# Patient Record
Sex: Female | Born: 1980 | Race: Black or African American | Hispanic: No | Marital: Married | Smoking: Never smoker
Health system: Southern US, Community
[De-identification: ages and names within clinical notes are randomized; demographics above are authoritative.]

## PROBLEM LIST (undated history)

## (undated) DIAGNOSIS — R519 Headache, unspecified: Secondary | ICD-10-CM

## (undated) DIAGNOSIS — O24419 Gestational diabetes mellitus in pregnancy, unspecified control: Secondary | ICD-10-CM

## (undated) DIAGNOSIS — I10 Essential (primary) hypertension: Secondary | ICD-10-CM

## (undated) DIAGNOSIS — R87629 Unspecified abnormal cytological findings in specimens from vagina: Secondary | ICD-10-CM

## (undated) DIAGNOSIS — R51 Headache: Secondary | ICD-10-CM

## (undated) DIAGNOSIS — R000 Tachycardia, unspecified: Secondary | ICD-10-CM

## (undated) DIAGNOSIS — Z9889 Other specified postprocedural states: Secondary | ICD-10-CM

## (undated) DIAGNOSIS — O09299 Supervision of pregnancy with other poor reproductive or obstetric history, unspecified trimester: Secondary | ICD-10-CM

## (undated) DIAGNOSIS — D649 Anemia, unspecified: Secondary | ICD-10-CM

## (undated) DIAGNOSIS — K219 Gastro-esophageal reflux disease without esophagitis: Secondary | ICD-10-CM

## (undated) HISTORY — PX: LEEP: SHX91

## (undated) HISTORY — PX: CERVICAL CERCLAGE: SHX1329

## (undated) HISTORY — DX: Gestational diabetes mellitus in pregnancy, unspecified control: O24.419

## (undated) HISTORY — PX: FOOT SURGERY: SHX648

## (undated) HISTORY — PX: ABDOMINAL CERCLAGE: SHX5384

## (undated) HISTORY — DX: Other specified postprocedural states: Z98.890

## (undated) HISTORY — PX: ANKLE SURGERY: SHX546

## (undated) HISTORY — DX: Supervision of pregnancy with other poor reproductive or obstetric history, unspecified trimester: O09.299

---

## 2008-09-24 ENCOUNTER — Emergency Department: Payer: Self-pay | Admitting: Emergency Medicine

## 2008-09-27 ENCOUNTER — Ambulatory Visit: Payer: Self-pay | Admitting: Orthopedic Surgery

## 2008-10-02 ENCOUNTER — Ambulatory Visit: Payer: Self-pay | Admitting: Orthopedic Surgery

## 2009-11-12 IMAGING — CR RIGHT ANKLE - COMPLETE 3+ VIEW
1 series · 5 of 5 positions shown · non-contrast
Comparison: none

REASON FOR EXAM: ankle pain and deformity s/p fall
COMMENTS:   LMP: One week ago

[Series 1: view not recorded · 0.17mm/px · 5 of 5 slices shown]
[im 1/5]
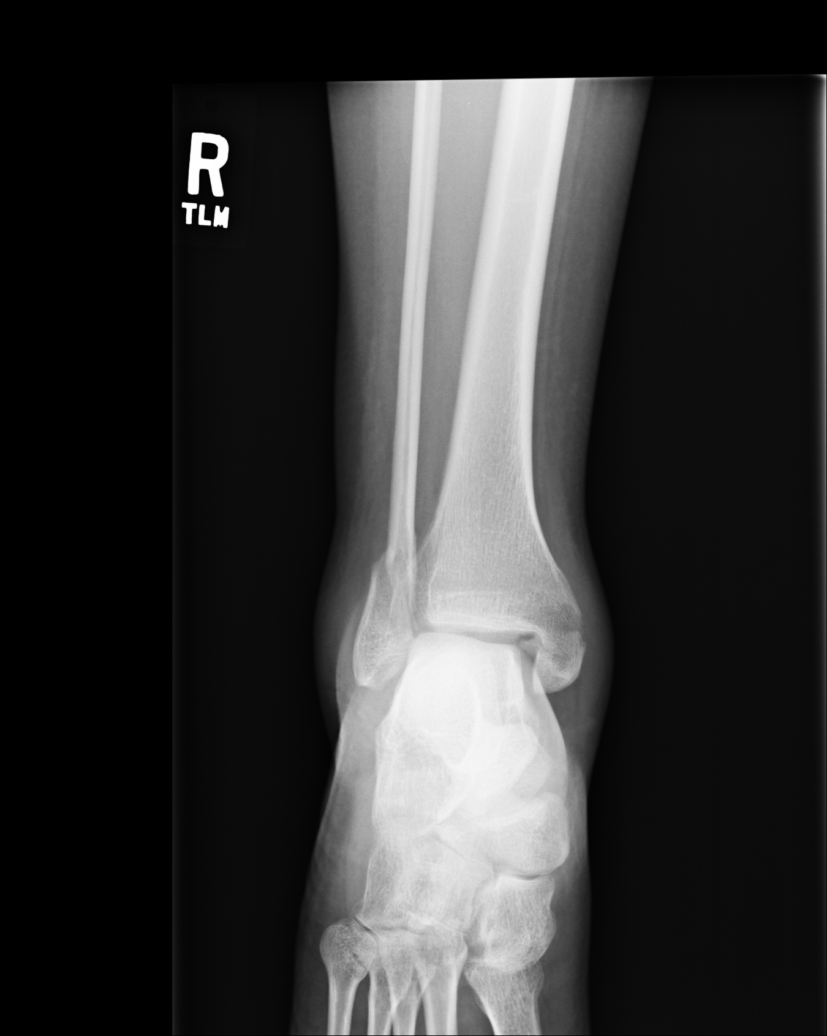
[im 2/5]
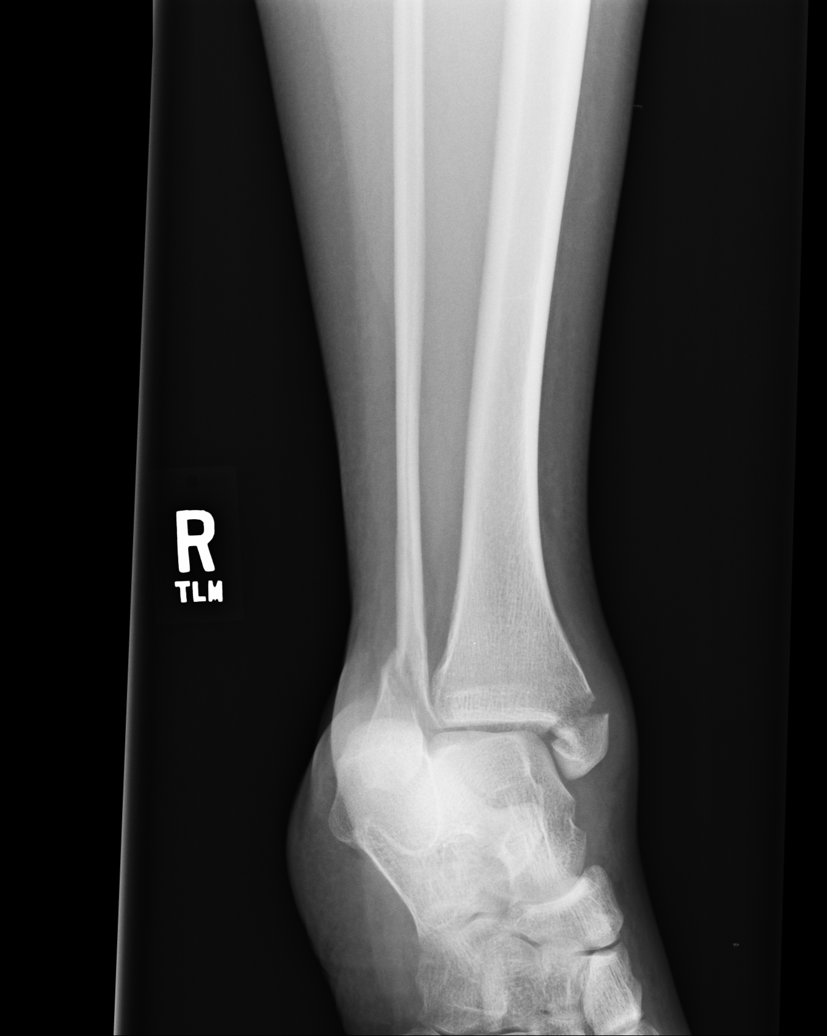
[im 3/5]
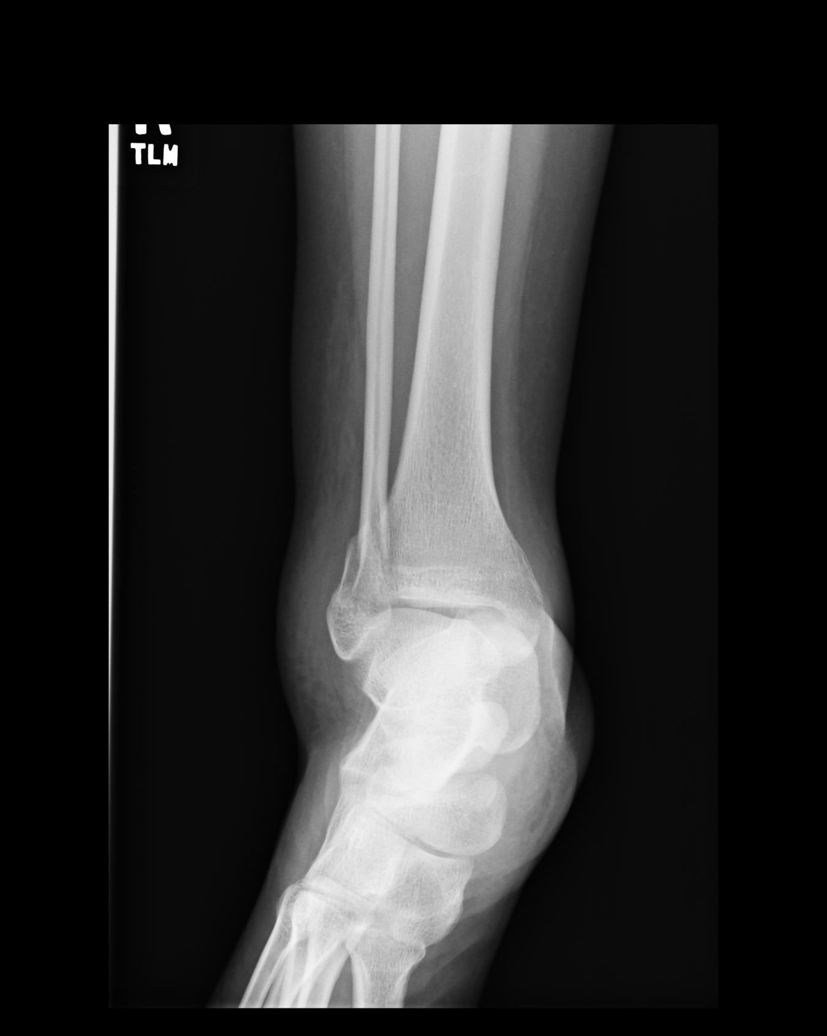
[im 4/5]
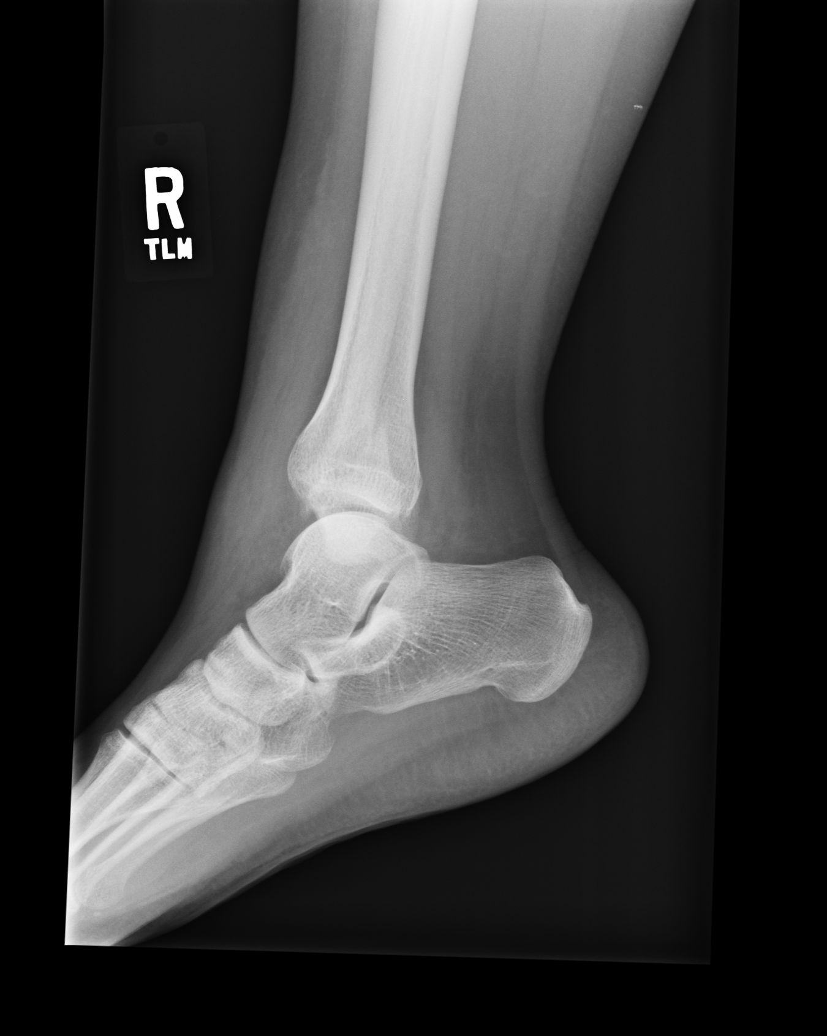
[im 5/5]
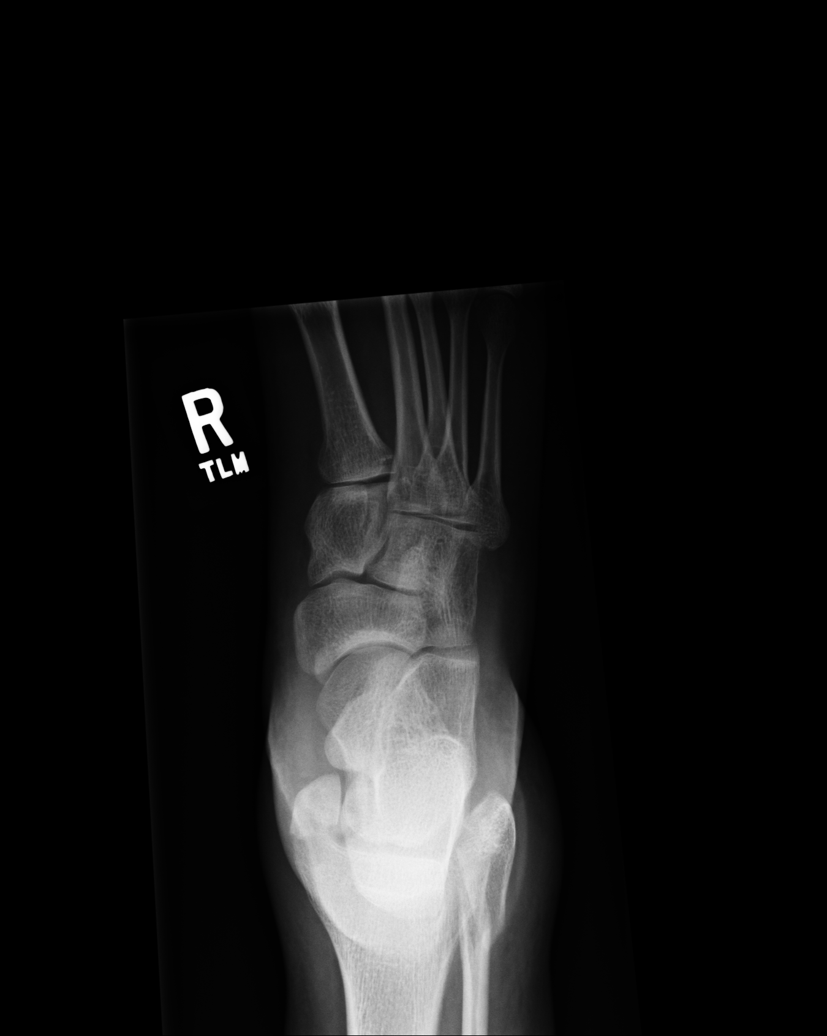

[5 of 5 positions shown; findings below may reference images not displayed]

PROCEDURE:     DXR - DXR ANKLE RIGHT COMPLETE  - September 25, 2008 [DATE]

RESULT:     There is a mildly comminuted fracture of the medial malleolus.
The fracture is open medially by approximately 6 to 7 mm. The ankle mortise
is slightly asymmetrical. An oblique fracture of the fibula is noted with
the distal component approximately 6 mm lateral in position to the proximal
component. No fracture of the posterior tibia is identified on the current
exam.
IMPRESSION: 1.     Bimalleolar fracture with slight asymmetry of the ankle mortise noted.

## 2014-09-16 DIAGNOSIS — O3430 Maternal care for cervical incompetence, unspecified trimester: Secondary | ICD-10-CM | POA: Insufficient documentation

## 2014-12-11 DIAGNOSIS — D252 Subserosal leiomyoma of uterus: Secondary | ICD-10-CM | POA: Insufficient documentation

## 2014-12-24 DIAGNOSIS — O99011 Anemia complicating pregnancy, first trimester: Secondary | ICD-10-CM | POA: Insufficient documentation

## 2015-05-21 ENCOUNTER — Encounter: Payer: Federal, State, Local not specified - PPO | Attending: Obstetrics

## 2015-05-21 ENCOUNTER — Ambulatory Visit: Payer: Self-pay

## 2015-05-21 VITALS — Ht 67.5 in | Wt 247.7 lb

## 2015-05-21 DIAGNOSIS — O24419 Gestational diabetes mellitus in pregnancy, unspecified control: Secondary | ICD-10-CM | POA: Diagnosis present

## 2015-05-21 DIAGNOSIS — Z713 Dietary counseling and surveillance: Secondary | ICD-10-CM | POA: Insufficient documentation

## 2015-05-27 NOTE — Progress Notes (Signed)
  Patient was seen on 05/21/15 for Gestational Diabetes self-management . The following learning objectives were met by the patient :   States the definition of Gestational Diabetes  States why dietary management is important in controlling blood glucose  Describes the effects of carbohydrates on blood glucose levels  Demonstrates ability to create a balanced meal plan  Demonstrates carbohydrate counting   States when to check blood glucose levels  Demonstrates proper blood glucose monitoring techniques  States the effect of stress and exercise on blood glucose levels  States the importance of limiting caffeine and abstaining from alcohol and smoking  Plan:  Aim for 2 Carb Choices per meal (30 grams) +/- 1 either way for breakfast Aim for 3 Carb Choices per meal (45 grams) +/- 1 either way from lunch and dinner Aim for 1-2 Carbs per snack Begin reading food labels for Total Carbohydrate and sugar grams of foods Consider  increasing your activity level by walking daily as tolerated Begin checking BG before breakfast and 1-2 hours after first bit of breakfast, lunch and dinner after  as directed by MD  Take medication  as directed by MD  Blood glucose monitor given: One Touch Verio Flex Lot # [id number] Exp: 05/2016  Patient instructed to monitor glucose levels: FBS: 60 - <90 1 hour: <140 2 hour: <120  Patient received the following handouts:  Nutrition Diabetes and Pregnancy  Carbohydrate Counting List  Meal Planning worksheet  Patient will be seen for follow-up as needed.    

## 2015-06-06 ENCOUNTER — Other Ambulatory Visit: Payer: Self-pay | Admitting: Obstetrics

## 2015-06-29 ENCOUNTER — Encounter (HOSPITAL_COMMUNITY): Payer: Self-pay

## 2015-06-29 ENCOUNTER — Inpatient Hospital Stay (HOSPITAL_COMMUNITY): Payer: Federal, State, Local not specified - PPO | Admitting: Anesthesiology

## 2015-06-29 ENCOUNTER — Inpatient Hospital Stay (HOSPITAL_COMMUNITY): Payer: Federal, State, Local not specified - PPO

## 2015-06-29 ENCOUNTER — Encounter (HOSPITAL_COMMUNITY)
Admission: EM | Disposition: A | Discharge status: Home / Self Care | Payer: Self-pay | Source: Ambulatory Visit | Attending: Obstetrics & Gynecology

## 2015-06-29 ENCOUNTER — Inpatient Hospital Stay (HOSPITAL_COMMUNITY)
Admission: EM | Admit: 2015-06-29 | Discharge: 2015-07-03 | DRG: 765 | Disposition: A | Discharge status: Home / Self Care | Payer: Federal, State, Local not specified - PPO | Source: Ambulatory Visit | Attending: Obstetrics & Gynecology | Admitting: Obstetrics & Gynecology

## 2015-06-29 DIAGNOSIS — O3433 Maternal care for cervical incompetence, third trimester: Secondary | ICD-10-CM | POA: Diagnosis present

## 2015-06-29 DIAGNOSIS — O9902 Anemia complicating childbirth: Secondary | ICD-10-CM | POA: Diagnosis present

## 2015-06-29 DIAGNOSIS — O9963 Diseases of the digestive system complicating the puerperium: Secondary | ICD-10-CM | POA: Diagnosis not present

## 2015-06-29 DIAGNOSIS — O4693 Antepartum hemorrhage, unspecified, third trimester: Secondary | ICD-10-CM | POA: Diagnosis present

## 2015-06-29 DIAGNOSIS — Z9114 Patient's other noncompliance with medication regimen: Secondary | ICD-10-CM

## 2015-06-29 DIAGNOSIS — O4103X0 Oligohydramnios, third trimester, not applicable or unspecified: Secondary | ICD-10-CM | POA: Diagnosis present

## 2015-06-29 DIAGNOSIS — O99214 Obesity complicating childbirth: Secondary | ICD-10-CM | POA: Diagnosis present

## 2015-06-29 DIAGNOSIS — O321XX0 Maternal care for breech presentation, not applicable or unspecified: Secondary | ICD-10-CM | POA: Diagnosis present

## 2015-06-29 DIAGNOSIS — Z3483 Encounter for supervision of other normal pregnancy, third trimester: Secondary | ICD-10-CM | POA: Diagnosis present

## 2015-06-29 DIAGNOSIS — Z3A35 35 weeks gestation of pregnancy: Secondary | ICD-10-CM | POA: Diagnosis not present

## 2015-06-29 DIAGNOSIS — K5900 Constipation, unspecified: Secondary | ICD-10-CM | POA: Diagnosis not present

## 2015-06-29 DIAGNOSIS — D62 Acute posthemorrhagic anemia: Secondary | ICD-10-CM | POA: Diagnosis present

## 2015-06-29 DIAGNOSIS — O1414 Severe pre-eclampsia complicating childbirth: Principal | ICD-10-CM | POA: Diagnosis present

## 2015-06-29 DIAGNOSIS — O24420 Gestational diabetes mellitus in childbirth, diet controlled: Secondary | ICD-10-CM | POA: Diagnosis present

## 2015-06-29 DIAGNOSIS — E669 Obesity, unspecified: Secondary | ICD-10-CM | POA: Diagnosis present

## 2015-06-29 DIAGNOSIS — Z6839 Body mass index (BMI) 39.0-39.9, adult: Secondary | ICD-10-CM

## 2015-06-29 HISTORY — DX: Anemia, unspecified: D64.9

## 2015-06-29 HISTORY — DX: Headache: R51

## 2015-06-29 HISTORY — DX: Essential (primary) hypertension: I10

## 2015-06-29 HISTORY — DX: Gestational diabetes mellitus in pregnancy, unspecified control: O24.419

## 2015-06-29 HISTORY — DX: Headache, unspecified: R51.9

## 2015-06-29 HISTORY — DX: Unspecified abnormal cytological findings in specimens from vagina: R87.629

## 2015-06-29 LAB — COMPREHENSIVE METABOLIC PANEL
ALT: 15 U/L (ref 14–54)
AST: 21 U/L (ref 15–41)
Albumin: 3 g/dL — ABNORMAL LOW (ref 3.5–5.0)
Alkaline Phosphatase: 72 U/L (ref 38–126)
Anion gap: 10 (ref 5–15)
BUN: 10 mg/dL (ref 6–20)
CO2: 22 mmol/L (ref 22–32)
Calcium: 8.8 mg/dL — ABNORMAL LOW (ref 8.9–10.3)
Chloride: 106 mmol/L (ref 101–111)
Creatinine, Ser: 0.6 mg/dL (ref 0.44–1.00)
GFR calc Af Amer: >60 mL/min (ref >60)
GFR calc non Af Amer: >60 mL/min (ref >60)
Glucose, Bld: 113 mg/dL — ABNORMAL HIGH (ref 65–99)
Potassium: 3.6 mmol/L (ref 3.5–5.1)
Sodium: 138 mmol/L (ref 135–145)
Total Bilirubin: 0.5 mg/dL (ref 0.3–1.2)
Total Protein: 6.9 g/dL (ref 6.5–8.1)

## 2015-06-29 LAB — URINE MICROSCOPIC-ADD ON: RBC / HPF: NONE SEEN RBC/hpf (ref 0–5)

## 2015-06-29 LAB — PROTEIN / CREATININE RATIO, URINE
Creatinine, Urine: 173 mg/dL
Protein Creatinine Ratio: 0.35 mg/mg{Cre} — ABNORMAL HIGH (ref 0.00–0.15)
Total Protein, Urine: 60 mg/dL

## 2015-06-29 LAB — URINALYSIS, ROUTINE W REFLEX MICROSCOPIC
Bilirubin Urine: NEGATIVE
Glucose, UA: NEGATIVE mg/dL
Hgb urine dipstick: NEGATIVE
KETONES UR: 15 mg/dL — AB
NITRITE: NEGATIVE
PROTEIN: 30 mg/dL — AB
Specific Gravity, Urine: 1.02 (ref 1.005–1.030)
pH: 5.5 (ref 5.0–8.0)

## 2015-06-29 LAB — CBC
HCT: 32.7 % — ABNORMAL LOW (ref 36.0–46.0)
Hemoglobin: 10.6 g/dL — ABNORMAL LOW (ref 12.0–15.0)
MCH: 28.2 pg (ref 26.0–34.0)
MCHC: 32.4 g/dL (ref 30.0–36.0)
MCV: 87 fL (ref 78.0–100.0)
Platelets: 251 10*3/uL (ref 150–400)
RBC: 3.76 MIL/uL — ABNORMAL LOW (ref 3.87–5.11)
RDW: 16.5 % — ABNORMAL HIGH (ref 11.5–15.5)
WBC: 6.7 10*3/uL (ref 4.0–10.5)

## 2015-06-29 LAB — TYPE AND SCREEN
ABO/RH(D): A POS
Antibody Screen: NEGATIVE

## 2015-06-29 LAB — PROTIME-INR
INR: 1.03 (ref 0.00–1.49)
Prothrombin Time: 13.7 seconds (ref 11.6–15.2)

## 2015-06-29 LAB — APTT: aPTT: 28 seconds (ref 24–37)

## 2015-06-29 LAB — SAMPLE TO BLOOD BANK

## 2015-06-29 LAB — URIC ACID: Uric Acid, Serum: 5.1 mg/dL (ref 2.3–6.6)

## 2015-06-29 LAB — FIBRINOGEN: Fibrinogen: 544 mg/dL — ABNORMAL HIGH (ref 204–475)

## 2015-06-29 LAB — ABO/RH: ABO/RH(D): A POS

## 2015-06-29 LAB — SAVE SMEAR

## 2015-06-29 SURGERY — Surgical Case
Anesthesia: Spinal

## 2015-06-29 MED ORDER — FENTANYL CITRATE (PF) 100 MCG/2ML IJ SOLN
INTRAMUSCULAR | Status: DC | PRN
  Administered 2015-06-29: 10 ug via EPIDURAL

## 2015-06-29 MED ORDER — ONDANSETRON HCL 4 MG/2ML IJ SOLN
INTRAMUSCULAR | Status: DC | PRN
  Administered 2015-06-29: 4 mg via INTRAVENOUS

## 2015-06-29 MED ORDER — LABETALOL HCL 5 MG/ML IV SOLN
INTRAVENOUS | Status: AC
  Filled 2015-06-29: qty 4

## 2015-06-29 MED ORDER — DIBUCAINE 1 % RE OINT: 1.0000 "application " | TOPICAL_OINTMENT | RECTAL | Status: DC | PRN

## 2015-06-29 MED ORDER — LABETALOL HCL 5 MG/ML IV SOLN
20.0000 mg | Freq: Once | INTRAVENOUS | Status: AC
  Administered 2015-06-29: 20 mg via INTRAVENOUS

## 2015-06-29 MED ORDER — FENTANYL CITRATE (PF) 100 MCG/2ML IJ SOLN
INTRAMUSCULAR | Status: AC
  Filled 2015-06-29: qty 2

## 2015-06-29 MED ORDER — MENTHOL 3 MG MT LOZG
1.0000 | LOZENGE | OROMUCOSAL | Status: DC | PRN
  Filled 2015-06-29: qty 9

## 2015-06-29 MED ORDER — OXYTOCIN 10 UNIT/ML IJ SOLN
INTRAMUSCULAR | Status: AC
  Filled 2015-06-29: qty 4

## 2015-06-29 MED ORDER — KETOROLAC TROMETHAMINE 30 MG/ML IJ SOLN
30.0000 mg | Freq: Four times a day (QID) | INTRAMUSCULAR | Status: AC | PRN
  Filled 2015-06-29: qty 1

## 2015-06-29 MED ORDER — NALOXONE HCL 2 MG/2ML IJ SOSY
1.0000 ug/kg/h | PREFILLED_SYRINGE | INTRAVENOUS | Status: DC | PRN
  Filled 2015-06-29: qty 2

## 2015-06-29 MED ORDER — NALBUPHINE HCL 10 MG/ML IJ SOLN: 5.0000 mg | Freq: Once | INTRAMUSCULAR | Status: DC | PRN

## 2015-06-29 MED ORDER — IBUPROFEN 600 MG PO TABS
600.0000 mg | ORAL_TABLET | Freq: Four times a day (QID) | ORAL | Status: DC
  Administered 2015-06-29 – 2015-07-02 (×10): 600 mg via ORAL
  Filled 2015-06-29 (×11): qty 1

## 2015-06-29 MED ORDER — DIPHENHYDRAMINE HCL 25 MG PO CAPS
25.0000 mg | ORAL_CAPSULE | Freq: Four times a day (QID) | ORAL | Status: DC | PRN
  Administered 2015-06-30: 25 mg via ORAL
  Filled 2015-06-29 (×2): qty 1

## 2015-06-29 MED ORDER — DEXTROSE 5 % IV SOLN
2.0000 g | INTRAVENOUS | Status: AC
  Administered 2015-06-29: 2 g via INTRAVENOUS
  Filled 2015-06-29: qty 2

## 2015-06-29 MED ORDER — ZOLPIDEM TARTRATE 5 MG PO TABS: 5.0000 mg | ORAL_TABLET | Freq: Every evening | ORAL | Status: DC | PRN

## 2015-06-29 MED ORDER — ACETAMINOPHEN 325 MG PO TABS
650.0000 mg | ORAL_TABLET | ORAL | Status: DC | PRN
  Administered 2015-06-30 – 2015-07-02 (×2): 650 mg via ORAL
  Filled 2015-06-29 (×2): qty 2

## 2015-06-29 MED ORDER — LABETALOL HCL 5 MG/ML IV SOLN
20.0000 mg | Freq: Once | INTRAVENOUS | Status: AC
  Administered 2015-06-29: 20 mg via INTRAVENOUS
  Filled 2015-06-29: qty 4

## 2015-06-29 MED ORDER — ACETAMINOPHEN 325 MG PO TABS: 650.0000 mg | ORAL_TABLET | ORAL | Status: DC | PRN

## 2015-06-29 MED ORDER — LACTATED RINGERS IV SOLN
INTRAVENOUS | Status: DC | PRN
  Administered 2015-06-29 (×2): via INTRAVENOUS

## 2015-06-29 MED ORDER — SODIUM CHLORIDE 0.9 % IJ SOLN: 3.0000 mL | INTRAMUSCULAR | Status: DC | PRN

## 2015-06-29 MED ORDER — NALOXONE HCL 0.4 MG/ML IJ SOLN: 0.4000 mg | INTRAMUSCULAR | Status: DC | PRN

## 2015-06-29 MED ORDER — FENTANYL CITRATE (PF) 100 MCG/2ML IJ SOLN
25.0000 ug | INTRAMUSCULAR | Status: DC | PRN
Start: 2015-06-29 — End: 2015-06-29

## 2015-06-29 MED ORDER — LACTATED RINGERS IV SOLN
INTRAVENOUS | Status: DC
  Administered 2015-06-29: 22:00:00 via INTRAVENOUS

## 2015-06-29 MED ORDER — PHENYLEPHRINE 8 MG IN D5W 100 ML (0.08MG/ML) PREMIX OPTIME
INJECTION | INTRAVENOUS | Status: DC | PRN
  Administered 2015-06-29: 50 ug/min via INTRAVENOUS

## 2015-06-29 MED ORDER — NALBUPHINE HCL 10 MG/ML IJ SOLN: 5.0000 mg | INTRAMUSCULAR | Status: DC | PRN

## 2015-06-29 MED ORDER — WITCH HAZEL-GLYCERIN EX PADS: 1.0000 "application " | MEDICATED_PAD | CUTANEOUS | Status: DC | PRN

## 2015-06-29 MED ORDER — CITRIC ACID-SODIUM CITRATE 334-500 MG/5ML PO SOLN
ORAL | Status: AC
  Administered 2015-06-29: 30 mL
  Filled 2015-06-29: qty 15

## 2015-06-29 MED ORDER — LANOLIN HYDROUS EX OINT: 1.0000 "application " | TOPICAL_OINTMENT | CUTANEOUS | Status: DC | PRN

## 2015-06-29 MED ORDER — BUPIVACAINE LIPOSOME 1.3 % IJ SUSP
20.0000 mL | Freq: Once | INTRAMUSCULAR | Status: DC
  Filled 2015-06-29: qty 20

## 2015-06-29 MED ORDER — ACETAMINOPHEN 500 MG PO TABS
1000.0000 mg | ORAL_TABLET | Freq: Four times a day (QID) | ORAL | Status: AC
  Administered 2015-06-29 – 2015-06-30 (×3): 1000 mg via ORAL
  Filled 2015-06-29 (×4): qty 2

## 2015-06-29 MED ORDER — OXYCODONE-ACETAMINOPHEN 5-325 MG PO TABS
2.0000 | ORAL_TABLET | ORAL | Status: DC | PRN
  Administered 2015-07-01: 2 via ORAL
  Filled 2015-06-29: qty 2

## 2015-06-29 MED ORDER — DIPHENHYDRAMINE HCL 50 MG/ML IJ SOLN
12.5000 mg | INTRAMUSCULAR | Status: DC | PRN
  Administered 2015-06-30: 12.5 mg via INTRAVENOUS
  Filled 2015-06-29: qty 1

## 2015-06-29 MED ORDER — KETOROLAC TROMETHAMINE 30 MG/ML IJ SOLN
30.0000 mg | Freq: Four times a day (QID) | INTRAMUSCULAR | Status: AC | PRN
Start: 2015-06-29 — End: 2015-06-30
  Filled 2015-06-29: qty 1

## 2015-06-29 MED ORDER — SIMETHICONE 80 MG PO CHEW
80.0000 mg | CHEWABLE_TABLET | Freq: Three times a day (TID) | ORAL | Status: DC
  Administered 2015-06-30 – 2015-07-03 (×11): 80 mg via ORAL
  Filled 2015-06-29 (×10): qty 1

## 2015-06-29 MED ORDER — LACTATED RINGERS IV SOLN: 2.5000 [IU]/h | INTRAVENOUS | Status: AC

## 2015-06-29 MED ORDER — LABETALOL HCL 100 MG PO TABS
100.0000 mg | ORAL_TABLET | Freq: Three times a day (TID) | ORAL | Status: DC
  Administered 2015-06-29: 100 mg via ORAL
  Filled 2015-06-29: qty 1

## 2015-06-29 MED ORDER — MORPHINE SULFATE (PF) 0.5 MG/ML IJ SOLN
INTRAMUSCULAR | Status: DC | PRN
  Administered 2015-06-29: .2 mg via EPIDURAL

## 2015-06-29 MED ORDER — SCOPOLAMINE 1 MG/3DAYS TD PT72: 1.0000 | MEDICATED_PATCH | Freq: Once | TRANSDERMAL | Status: DC

## 2015-06-29 MED ORDER — DIPHENHYDRAMINE HCL 25 MG PO CAPS
25.0000 mg | ORAL_CAPSULE | ORAL | Status: DC | PRN
  Administered 2015-06-29: 25 mg via ORAL
  Filled 2015-06-29: qty 1

## 2015-06-29 MED ORDER — MAGNESIUM SULFATE BOLUS VIA INFUSION
4.0000 g | Freq: Once | INTRAVENOUS | Status: DC
  Filled 2015-06-29: qty 500

## 2015-06-29 MED ORDER — TETANUS-DIPHTH-ACELL PERTUSSIS 5-2.5-18.5 LF-MCG/0.5 IM SUSP
0.5000 mL | Freq: Once | INTRAMUSCULAR | Status: DC
  Filled 2015-06-29: qty 0.5

## 2015-06-29 MED ORDER — PROMETHAZINE HCL 25 MG/ML IJ SOLN: 6.2500 mg | INTRAMUSCULAR | Status: DC | PRN

## 2015-06-29 MED ORDER — BUPIVACAINE IN DEXTROSE 0.75-8.25 % IT SOLN
INTRATHECAL | Status: DC | PRN
  Administered 2015-06-29: 12.5 mg via INTRATHECAL

## 2015-06-29 MED ORDER — BETAMETHASONE SOD PHOS & ACET 6 (3-3) MG/ML IJ SUSP
12.0000 mg | INTRAMUSCULAR | Status: DC
  Administered 2015-06-29: 12 mg via INTRAMUSCULAR
  Filled 2015-06-29: qty 2

## 2015-06-29 MED ORDER — SODIUM CHLORIDE 0.9 % IJ SOLN
INTRAMUSCULAR | Status: AC
  Filled 2015-06-29: qty 30

## 2015-06-29 MED ORDER — BUPIVACAINE HCL 0.25 % IJ SOLN
INTRAMUSCULAR | Status: DC | PRN
  Administered 2015-06-29: 20 mL

## 2015-06-29 MED ORDER — MEPERIDINE HCL 25 MG/ML IJ SOLN: 6.2500 mg | INTRAMUSCULAR | Status: DC | PRN

## 2015-06-29 MED ORDER — LACTATED RINGERS IV SOLN
INTRAVENOUS | Status: DC
  Administered 2015-06-29: 15:00:00 via INTRAVENOUS

## 2015-06-29 MED ORDER — MAGNESIUM SULFATE 50 % IJ SOLN
2.0000 g/h | INTRAVENOUS | Status: AC
  Administered 2015-06-29: 4 g/h via INTRAVENOUS
  Administered 2015-06-30: 2 g/h via INTRAVENOUS
  Filled 2015-06-29 (×2): qty 80

## 2015-06-29 MED ORDER — OXYTOCIN 10 UNIT/ML IJ SOLN
40.0000 [IU] | INTRAVENOUS | Status: DC | PRN
  Administered 2015-06-29: 40 [IU] via INTRAVENOUS

## 2015-06-29 MED ORDER — ONDANSETRON HCL 4 MG/2ML IJ SOLN
INTRAMUSCULAR | Status: AC
  Filled 2015-06-29: qty 2

## 2015-06-29 MED ORDER — LABETALOL HCL 200 MG PO TABS
200.0000 mg | ORAL_TABLET | Freq: Three times a day (TID) | ORAL | Status: DC
  Administered 2015-06-30 – 2015-07-02 (×7): 200 mg via ORAL
  Filled 2015-06-29 (×2): qty 1
  Filled 2015-06-29 (×7): qty 2

## 2015-06-29 MED ORDER — PRENATAL MULTIVITAMIN CH
1.0000 | ORAL_TABLET | Freq: Every day | ORAL | Status: DC
  Administered 2015-06-30 – 2015-07-03 (×4): 1 via ORAL
  Filled 2015-06-29 (×4): qty 1

## 2015-06-29 MED ORDER — HYDRALAZINE HCL 20 MG/ML IJ SOLN
5.0000 mg | INTRAMUSCULAR | Status: DC | PRN
  Administered 2015-06-29 (×2): 5 mg via INTRAVENOUS
  Filled 2015-06-29 (×2): qty 1

## 2015-06-29 MED ORDER — BUPIVACAINE HCL (PF) 0.25 % IJ SOLN
INTRAMUSCULAR | Status: AC
  Filled 2015-06-29: qty 30

## 2015-06-29 MED ORDER — OXYCODONE-ACETAMINOPHEN 5-325 MG PO TABS
1.0000 | ORAL_TABLET | ORAL | Status: DC | PRN
  Administered 2015-06-30 – 2015-07-01 (×4): 1 via ORAL
  Filled 2015-06-29 (×4): qty 1

## 2015-06-29 MED ORDER — PHENYLEPHRINE 8 MG IN D5W 100 ML (0.08MG/ML) PREMIX OPTIME
INJECTION | INTRAVENOUS | Status: AC
  Filled 2015-06-29: qty 100

## 2015-06-29 MED ORDER — ONDANSETRON HCL 4 MG/2ML IJ SOLN: 4.0000 mg | Freq: Three times a day (TID) | INTRAMUSCULAR | Status: DC | PRN

## 2015-06-29 MED ORDER — SCOPOLAMINE 1 MG/3DAYS TD PT72
MEDICATED_PATCH | TRANSDERMAL | Status: AC
  Filled 2015-06-29: qty 1

## 2015-06-29 MED ORDER — SIMETHICONE 80 MG PO CHEW
80.0000 mg | CHEWABLE_TABLET | ORAL | Status: DC
  Administered 2015-06-30 – 2015-07-02 (×4): 80 mg via ORAL
  Filled 2015-06-29 (×4): qty 1

## 2015-06-29 MED ORDER — BUPIVACAINE LIPOSOME 1.3 % IJ SUSP
INTRAMUSCULAR | Status: DC | PRN
  Administered 2015-06-29: 20 mL

## 2015-06-29 MED ORDER — SENNOSIDES-DOCUSATE SODIUM 8.6-50 MG PO TABS
2.0000 | ORAL_TABLET | ORAL | Status: DC
  Administered 2015-06-30 – 2015-07-02 (×3): 2 via ORAL
  Filled 2015-06-29 (×6): qty 2

## 2015-06-29 MED ORDER — MORPHINE SULFATE (PF) 0.5 MG/ML IJ SOLN
INTRAMUSCULAR | Status: AC
  Filled 2015-06-29: qty 10

## 2015-06-29 MED ORDER — SIMETHICONE 80 MG PO CHEW
80.0000 mg | CHEWABLE_TABLET | ORAL | Status: DC | PRN
  Administered 2015-06-30: 80 mg via ORAL

## 2015-06-29 MED ORDER — SCOPOLAMINE 1 MG/3DAYS TD PT72
MEDICATED_PATCH | TRANSDERMAL | Status: DC | PRN
  Administered 2015-06-29: 1 via TRANSDERMAL

## 2015-06-29 SURGICAL SUPPLY — 42 items
BENZOIN TINCTURE PRP APPL 2/3 (GAUZE/BANDAGES/DRESSINGS) ×3 IMPLANT
CLAMP CORD UMBIL (MISCELLANEOUS) IMPLANT
CLOSURE WOUND 1/2 X4 (GAUZE/BANDAGES/DRESSINGS) ×1
CLOTH BEACON ORANGE TIMEOUT ST (SAFETY) ×3 IMPLANT
CONTAINER PREFILL 10% NBF 15ML (MISCELLANEOUS) IMPLANT
DRAPE SHEET LG 3/4 BI-LAMINATE (DRAPES) IMPLANT
DRSG OPSITE POSTOP 4X10 (GAUZE/BANDAGES/DRESSINGS) ×3 IMPLANT
DURAPREP 26ML APPLICATOR (WOUND CARE) ×3 IMPLANT
ELECT REM PT RETURN 9FT ADLT (ELECTROSURGICAL) ×3
ELECTRODE REM PT RTRN 9FT ADLT (ELECTROSURGICAL) ×1 IMPLANT
EXTRACTOR VACUUM M CUP 4 TUBE (SUCTIONS) IMPLANT
EXTRACTOR VACUUM M CUP 4' TUBE (SUCTIONS)
GLOVE BIO SURGEON STRL SZ 6.5 (GLOVE) ×2 IMPLANT
GLOVE BIO SURGEONS STRL SZ 6.5 (GLOVE) ×1
GLOVE BIOGEL PI IND STRL 7.0 (GLOVE) ×2 IMPLANT
GLOVE BIOGEL PI INDICATOR 7.0 (GLOVE) ×4
GOWN STRL REUS W/TWL LRG LVL3 (GOWN DISPOSABLE) ×6 IMPLANT
KIT ABG SYR 3ML LUER SLIP (SYRINGE) IMPLANT
LIQUID BAND (GAUZE/BANDAGES/DRESSINGS) IMPLANT
NEEDLE HYPO 22GX1.5 SAFETY (NEEDLE) ×3 IMPLANT
NEEDLE HYPO 25X5/8 SAFETYGLIDE (NEEDLE) IMPLANT
PACK C SECTION WH (CUSTOM PROCEDURE TRAY) ×3 IMPLANT
PAD OB MATERNITY 4.3X12.25 (PERSONAL CARE ITEMS) ×3 IMPLANT
RTRCTR C-SECT PINK 25CM LRG (MISCELLANEOUS) ×3 IMPLANT
STRIP CLOSURE SKIN 1/2X4 (GAUZE/BANDAGES/DRESSINGS) ×2 IMPLANT
SUT MNCRL AB 3-0 PS2 27 (SUTURE) IMPLANT
SUT MON AB 4-0 PS1 27 (SUTURE) IMPLANT
SUT PLAIN 0 NONE (SUTURE) IMPLANT
SUT PLAIN 2 0 (SUTURE) ×2
SUT PLAIN ABS 2-0 CT1 27XMFL (SUTURE) ×1 IMPLANT
SUT VIC AB 0 CT1 27 (SUTURE) ×4
SUT VIC AB 0 CT1 27XBRD ANBCTR (SUTURE) ×2 IMPLANT
SUT VIC AB 0 CTX 36 (SUTURE) ×4
SUT VIC AB 0 CTX36XBRD ANBCTRL (SUTURE) ×2 IMPLANT
SUT VIC AB 2-0 CT1 27 (SUTURE) ×2
SUT VIC AB 2-0 CT1 TAPERPNT 27 (SUTURE) ×1 IMPLANT
SUT VIC AB 3-0 SH 27 (SUTURE)
SUT VIC AB 3-0 SH 27X BRD (SUTURE) IMPLANT
SUT VIC AB 4-0 PS2 27 (SUTURE) IMPLANT
SYR CONTROL 10ML LL (SYRINGE) ×3 IMPLANT
TOWEL OR 17X24 6PK STRL BLUE (TOWEL DISPOSABLE) ×3 IMPLANT
TRAY FOLEY CATH SILVER 14FR (SET/KITS/TRAYS/PACK) ×3 IMPLANT

## 2015-06-29 NOTE — Transfer of Care (Signed)
Immediate Anesthesia Transfer of Care Note  Patient: Lorraine Gibson  Procedure(s) Performed: Procedure(s) with comments: CESAREAN SECTION (N/A) - Most likely surgery after %PM - will be Dr Ronita Hipps instead of Lavoie ATE full meal 1400  Patient Location: PACU  Anesthesia Type:Spinal  Level of Consciousness: awake  Airway & Oxygen Therapy: Patient Spontanous Breathing  Post-op Assessment: Report given to RN and Post -op Vital signs reviewed and stable  Post vital signs: stable  Last Vitals:  Filed Vitals:   06/29/15 1701 06/29/15 1737  BP: 164/110 177/105  Pulse: 82 86  Temp:    Resp:      Complications: No apparent anesthesia complications

## 2015-06-29 NOTE — H&P (Signed)
OB ADMISSION/ HISTORY & PHYSICAL:  Admission Date: 06/29/2015  1:41 PM  Admit Diagnosis: 35 weeks / third trimester bleeding / breech / preeclampsia / uncontrolled hypertension /   Lorraine Gibson is a 35 y.o. female presenting for bleeding.  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 Gestational diabetes mellitus (GDM), antepartum   . Hx of cerclage, currently pregnant   . Hypertension   . Gestational diabetes   . Anemia   . Headache   . Vaginal Pap smear, abnormal      Past surgical history:  Past Surgical History  Procedure Laterality Date  . Foot surgery    . Cervical cerclage    . Cesarean section    . Ankle surgery    . Leep      Family History:  Family History  Problem Relation Age of Onset  . Hypertension Mother   . Cancer Mother   . Hypertension Father   . Kidney disease Father   . Heart disease Father   . Cancer Father   . Hypertension Brother   . Diabetes Brother   . Kidney disease Brother      Social History:  reports that she has never smoked. She does not have any smokeless tobacco history on file. Her alcohol and drug histories are not on file.   Allergies: Review of patient's allergies indicates no known allergies.    Current Medications at time of admission:  Prior to Admission medications   Medication Sig Start Date End Date Taking? Authorizing Provider  aspirin 81 MG tablet Take 81 mg by mouth daily.   Yes Historical Provider, MD   Fe Cbn-Fe Gluc-FA-B12-C-DSS (FERRALET 90 PO) Take 1 tablet by mouth daily.   Yes Historical Provider, MD  labetalol (NORMODYNE) 100 MG tablet Take 100 mg by mouth 3 (three) times daily.   Yes Historical Provider, MD  Prenatal Vit-Fe Fumarate-FA (PRENATAL MULTIVITAMIN) TABS tablet Take 1 tablet by mouth daily at 12 noon.   Yes Historical Provider, MD  calcium carbonate (TUMS - DOSED IN MG ELEMENTAL CALCIUM) 500 MG chewable tablet Chew 1 tablet by mouth 2 (two) times daily as needed for indigestion or heartburn.    Historical Provider, MD    Review of Systems: Active FM Denies ctx or cramping / constant severe pelvic pressure x 2 days No LOF Bright red spotting since this AM   Physical Exam:  VS: Blood pressure 145/94, pulse 85, temperature 98.8 F (37.1 C), temperature source Oral, resp. rate 18, height 5' 7.5" (1.715 m), weight 116.121 kg (256 lb).  General: alert and oriented, appears comfortable / reports frontal headache (2/10) Heart: RRR Lungs: Clear lung fields Abdomen: Gravid, soft and non-tender, non-distended / uterus: gravid Extremities: 2+ edema  Genitalia / VE:   unable to assess cervical status / abdominal cerclage with bulging LUS Bedside sono - breech presentation with subjectively very low AFI / (+) cardiac activity  FHR: baseline rate 145 / variability moderate /  accelerations absent / no decelerations TOCO: UI  Assessment: [redacted] weeks gestation Third trimester bleeding - likely related to cerclage  / no evidence of active labor Uncontrolled hypertension non-compliant with medication Preeclampsia  FHR category 1   Plan per phone consult with Dr Dellis Filbert:  Admit BMZ course NPO for CS this evening IV medication for hypertension control Bedrest / reduce pressure on cerclage  Dr Dellis Filbert following   Artelia Laroche CNM, MSN, [facility] 06/29/2015, 2:48 PM

## 2015-06-29 NOTE — Anesthesia Preprocedure Evaluation (Signed)
Anesthesia Evaluation  Patient identified by MRN, date of birth, ID band Patient awake    Reviewed: Allergy & Precautions, H&P , Patient's Chart, lab work & pertinent test results  Airway Mallampati: II  TM Distance: >3 FB Neck ROM: full    Dental no notable dental hx.    Pulmonary neg pulmonary ROS,    Pulmonary exam normal breath sounds clear to auscultation       Cardiovascular hypertension, Pt. on medications Normal cardiovascular exam Rhythm:regular Rate:Normal     Neuro/Psych  Headaches, negative psych ROS   GI/Hepatic negative GI ROS, Neg liver ROS,   Endo/Other  diabetes, Type 2Morbid obesity  Renal/GU negative Renal ROS  negative genitourinary   Musculoskeletal   Abdominal   Peds  Hematology  (+) anemia ,   Anesthesia Other Findings Pregnancy - currently with severe pre-eclampsia per elevated blood pressure with breech presentation and abdominal cerclage in place  Platelets and allergies reviewed Denies active cardiac or pulmonary symptoms, METS > 4  Denies blood thinning medications, bleeding disorders, hypertension, asthma, supine hypotension syndrome, previous anesthesia difficulties    Reproductive/Obstetrics (+) Pregnancy                             Anesthesia Physical Anesthesia Plan  ASA: III  Anesthesia Plan: Spinal   Post-op Pain Management:    Induction:   Airway Management Planned:   Additional Equipment:   Intra-op Plan:   Post-operative Plan:   Informed Consent: I have reviewed the patients History and Physical, chart, labs and discussed the procedure including the risks, benefits and alternatives for the proposed anesthesia with the patient or authorized representative who has indicated his/her understanding and acceptance.     Plan Discussed with:   Anesthesia Plan Comments: (Per Dr. Ronita Hipps case has to urgently go and is unable to wait for NPO  appropriateness)        Anesthesia Quick Evaluation

## 2015-06-29 NOTE — MAU Provider Note (Signed)
  History     CSN: [id number]  Arrival date and time: 06/29/15 1341 Provider her to see Patient @ 1345    Chief Complaint  Patient presents with  . Vaginal Bleeding   HPI Red bleeding with wiping all morning Denies cramps or ctx Intense pelvic pressure all week  Past Medical History  Diagnosis Date  . Gestational diabetes mellitus (GDM), antepartum   . Hx of cerclage, currently pregnant     *  Abdominal cerclage   * Gestational hypertension - newly diagnosed pre-eclampsia   * Lagging fetal growth  Past Surgical History  Procedure Laterality Date  . Foot surgery    . Cervical cerclage     No family history on file.  Social History  Substance Use Topics  . Smoking status: Not on file  . Smokeless tobacco: Not on file  . Alcohol Use: Not on file   Allergies: No Known Allergies  Prescriptions prior to admission  Medication Sig Dispense Refill Last Dose  . aspirin 81 MG tablet Take 81 mg by mouth daily.     . calcium carbonate (TUMS - DOSED IN MG ELEMENTAL CALCIUM) 500 MG chewable tablet Chew 1 tablet by mouth 2 (two) times daily as needed for indigestion or heartburn.     . Fe Cbn-Fe Gluc-FA-B12-C-DSS (FERRALET 90 PO) Take 1 tablet by mouth daily.     Marland Kitchen labetalol (NORMODYNE) 100 MG tablet Take 100 mg by mouth 3 (three) times daily.     . Prenatal Vit-Fe Fumarate-FA (PRENATAL MULTIVITAMIN) TABS tablet Take 1 tablet by mouth daily at 12 noon.      No medications taken this AM No CBG today Ate full meal @ 1400  ROS  Red bleeding with wiping all morning Pelvic pressure x 1 week but worse this weekend No headache or vision changes Swollen legs - worse this am  Physical Exam   Blood pressure 158/99, pulse 87, temperature 98.8 F (37.1 C), temperature source Oral, resp. rate 18, height 5' 7.5" (1.715 m), weight 116.121 kg (256 lb).   BP: 158/99 - 154/102 - 151/115 - 145/94  Physical Exam  Alert and oriented ./ NAD or pain Abdomen soft and non-tender / uterus  gravid and non-tender SSE - very difficult visualization due to patient discomfort with exams / unable to view cervix / red small blood in vaginal vault VE - bulging LUS / unable to assess cervix due to patient discomfort with exams Extremities 3+ edema / DTR 2+ no clonus  NST: FHR 145 baseline / moderate variability / no accels / no decels          Toco: UI with rare ctx  MAU Course  Procedures  bedside sono: frank breech / subjectively low AFI / (+) FM   Assessment and Plan  35.[redacted] weeks gestation with vaginal bleeding Lagging fetal growth Abdominal cerclage with incompetent cervix Gestational diabetes Gestational hypertension with pre-eclampsia  TC to Dr Dellis Filbert Admit NPO - notify OR / anesthesia of pending surgery IV hydralazine dose for > 160/100 SONO at bedside Continuous EFM BMZ dose now Notify with PIH results   Artelia Laroche 06/29/2015, 2:14 PM

## 2015-06-29 NOTE — Op Note (Signed)
Cesarean Section Procedure Note  Indications: malpresentation: frank breech, oligohydramnios, severe preeclampsia and abdominal cerclage  Pre-operative Diagnosis: 35 week 2 day pregnancy.  Post-operative Diagnosis: same  Surgeon: Lovenia Kim   Assistants: Janann Colonel , CNM  Anesthesia: Local anesthesia 0.25.% bupivacaine and Spinal anesthesia  ASA Class: 2  Procedure Details  The patient was seen in the Holding Room. The risks, benefits, complications, treatment options, and expected outcomes were discussed with the patient.  The patient concurred with the proposed plan, giving informed consent. The risks of anesthesia, infection, bleeding and possible injury to other organs discussed. Injury to bowel, bladder, or ureter with possible need for repair discussed. Possible need for transfusion with secondary risks of hepatitis or HIV acquisition discussed. Post operative complications to include but not limited to DVT, PE and Pneumonia noted. The site of surgery properly noted/marked. The patient was taken to Operating Room # 1, identified as Lorraine Gibson and the procedure verified as C-Section Delivery. A Time Out was held and the above information confirmed.  After induction of anesthesia, the patient was draped and prepped in the usual sterile manner. A Pfannenstiel incision was made and carried down through the subcutaneous tissue to the fascia. Fascial incision was made and extended transversely using Mayo scissors. The fascia was separated from the underlying rectus tissue superiorly and inferiorly. The peritoneum was identified and entered. Peritoneal incision was extended longitudinally. The utero-vesical peritoneal reflection was incised transversely and the bladder flap was bluntly freed from the lower uterine segment. A low transverse uterine incision(Kerr hysterotomy) was made. Delivered from frank breech presentation was a  female with Apgar scores of 8 at one minute and 9  at five minutes. Bulb suctioning gently performed. Neonatal team in attendance.After the umbilical cord was clamped and cut cord blood was obtained for evaluation. The placenta was removed intact and appeared normal. The uterus was curetted with a dry lap pack. Good hemostasis was noted.The uterine outline, tubes and ovaries appeared normal. The uterine incision was closed with running locked sutures of 0 Monocryl x 2 layers. Hemostasis was observed. The parietal peritoneum was closed with a running 2-0 Monocryl suture. The fascia was then reapproximated with running sutures of 0 Monocryl. The skin was reapproximated with 3-0 monocryl after Willow closure with 2-0 plain.  Instrument, sponge, and needle counts were correct prior the abdominal closure and at the conclusion of the case.   Findings: Viable female, frank breech, post placenta, multiple fibroids Abdominal cerclage intact. Nl adnexa palpable  Estimated Blood Loss:  500         Drains: foley                 Specimens: placenta to path                 Complications:  None; patient tolerated the procedure well.         Disposition: PACU - hemodynamically stable.         Condition: stable  Attending Attestation: I performed the procedure.

## 2015-06-29 NOTE — Anesthesia Postprocedure Evaluation (Signed)
Anesthesia Post Note  Patient: Lorraine Gibson  Procedure(s) Performed: Procedure(s) (LRB): CESAREAN SECTION (N/A)  Patient location during evaluation: PACU Anesthesia Type: Spinal Level of consciousness: oriented and awake and alert Pain management: pain level controlled Vital Signs Assessment: post-procedure vital signs reviewed and stable Respiratory status: spontaneous breathing, respiratory function stable and patient connected to nasal cannula oxygen Cardiovascular status: blood pressure returned to baseline and stable Postop Assessment: no headache, no backache and spinal receding Anesthetic complications: no Comments: Blood pressure elevated initially given severe pre-eclampsia, will give 10mg  labetalol and patient to be started on Mag infusion per Dr. Ronita Hipps    Last Vitals:  Filed Vitals:   06/29/15 1845 06/29/15 1900  BP: 161/96 152/90  Pulse: 73 74  Temp:    Resp: 18 23    Last Pain:  Filed Vitals:   06/29/15 1910  PainSc: 0-No pain                 Zenaida Deed

## 2015-06-29 NOTE — MAU Note (Signed)
After urinating this morning saw blood on her toilet tissue, very small clot on tissue, patient has a transabdominal cerclage and preeclampsia, gestational diabetes.

## 2015-06-29 NOTE — Anesthesia Procedure Notes (Signed)
Spinal Patient location during procedure: OR Staffing Anesthesiologist: Hollie Wojahn Performed by: anesthesiologist  Preanesthetic Checklist Completed: patient identified, site marked, surgical consent, pre-op evaluation, timeout performed, IV checked, risks and benefits discussed and monitors and equipment checked Spinal Block Patient position: sitting Prep: DuraPrep Patient monitoring: heart rate, continuous pulse ox and blood pressure Location: L4-5 Injection technique: single-shot Needle Needle type: Pencan  Needle gauge: 25 G Needle length: 9 cm Additional Notes Patient identified. Risks/Benefits/Options discussed with patient including but not limited to bleeding, infection, nerve damage, paralysis, failed block, incomplete pain control, headache, blood pressure changes, nausea, vomiting, reactions to medications, itching and postpartum back pain. Confirmed with bedside nurse the patient's most recent platelet count. Confirmed with patient that they are not currently taking any anticoagulation, have any bleeding history or any family history of bleeding disorders. Patient expressed understanding and wished to proceed. All questions were answered. Sterile technique was used throughout the entire procedure. Please see nursing notes for vital signs. Test dose was given through epidural catheter and negative prior to continuing to dose epidural or start infusion. Warning signs of high block given to the patient including shortness of breath, tingling/numbness in hands, complete motor block, or any concerning symptoms with instructions to call for help. Patient was given instructions on fall risk and not to get out of bed. All questions and concerns addressed with instructions to call with any issues or inadequate analgesia.

## 2015-06-29 NOTE — Progress Notes (Signed)
Lorraine Gibson is a 35 y.o. G2P0101 at [redacted]w[redacted]d by LMP admitted for vaginal bleeding  Subjective: Denies headache or epigastric pain. No CP or SOB  Objective: BP 164/110 mmHg  Pulse 82  Temp(Src) 98 F (36.7 C) (Oral)  Resp 18  Ht 5' 7.5" (1.715 m)  Wt 116.121 kg (256 lb)  BMI 39.48 kg/m2      FHT:  FHR: 145 bpm, variability: minimal ,  accelerations:  Abscent,  decelerations:  Absent UC:   irregular, every 10-15 minutes SVE:    deferred  Labs: Lab Results  Component Value Date   WBC 6.7 06/29/2015   HGB 10.6* 06/29/2015   HCT 32.7* 06/29/2015   MCV 87.0 06/29/2015   PLT 251 06/29/2015    Assessment / Plan: Severe PEC  35 weeks GDM Vaginal bleeding-? Abruption Abdominal cerclage Pilar Plate breech  Labor: Proceed with urgent csection Preeclampsia:  no signs or symptoms of toxicity, intake and ouput balanced and labs stable Fetal Wellbeing:  Category I and Category II Pain Control:  Labor support without medications I/D:  n/a Anticipated MOD:  Proceed with csection. Risks vs benefits discussed. Likelihood of NICU admission noted. Consent done  Lorraine Gibson J 06/29/2015, 5:21 PM

## 2015-06-30 ENCOUNTER — Encounter (HOSPITAL_COMMUNITY): Payer: Self-pay | Admitting: Certified Registered Nurse Anesthetist

## 2015-06-30 ENCOUNTER — Encounter (HOSPITAL_COMMUNITY): Payer: Self-pay | Admitting: Obstetrics & Gynecology

## 2015-06-30 LAB — CBC
HCT: 32.9 % — ABNORMAL LOW (ref 36.0–46.0)
HEMATOCRIT: 29.4 % — AB (ref 36.0–46.0)
HEMOGLOBIN: 9.4 g/dL — AB (ref 12.0–15.0)
Hemoglobin: 10.8 g/dL — ABNORMAL LOW (ref 12.0–15.0)
MCH: 28.7 pg (ref 26.0–34.0)
MCH: 28.3 pg (ref 26.0–34.0)
MCHC: 32.8 g/dL (ref 30.0–36.0)
MCHC: 32 g/dL (ref 30.0–36.0)
MCV: 87.5 fL (ref 78.0–100.0)
MCV: 88.6 fL (ref 78.0–100.0)
PLATELETS: 236 10*3/uL (ref 150–400)
PLATELETS: 214 10*3/uL (ref 150–400)
RBC: 3.76 MIL/uL — ABNORMAL LOW (ref 3.87–5.11)
RBC: 3.32 MIL/uL — AB (ref 3.87–5.11)
RDW: 16.6 % — AB (ref 11.5–15.5)
RDW: 17 % — ABNORMAL HIGH (ref 11.5–15.5)
WBC: 14.7 10*3/uL — AB (ref 4.0–10.5)
WBC: 11.5 10*3/uL — AB (ref 4.0–10.5)

## 2015-06-30 LAB — URIC ACID: URIC ACID, SERUM: 4.3 mg/dL (ref 2.3–6.6)

## 2015-06-30 LAB — COMPREHENSIVE METABOLIC PANEL
ALK PHOS: 63 U/L (ref 38–126)
ALT: 15 U/L (ref 14–54)
ALT: 14 U/L (ref 14–54)
ANION GAP: 10 (ref 5–15)
AST: 24 U/L (ref 15–41)
AST: 24 U/L (ref 15–41)
Albumin: 2.9 g/dL — ABNORMAL LOW (ref 3.5–5.0)
Albumin: 2.9 g/dL — ABNORMAL LOW (ref 3.5–5.0)
Alkaline Phosphatase: 72 U/L (ref 38–126)
Anion gap: 9 (ref 5–15)
BILIRUBIN TOTAL: 0.5 mg/dL (ref 0.3–1.2)
BUN: 7 mg/dL (ref 6–20)
BUN: 9 mg/dL (ref 6–20)
CALCIUM: 7.4 mg/dL — AB (ref 8.9–10.3)
CHLORIDE: 100 mmol/L — AB (ref 101–111)
CHLORIDE: 104 mmol/L (ref 101–111)
CO2: 24 mmol/L (ref 22–32)
CO2: 25 mmol/L (ref 22–32)
CREATININE: 0.73 mg/dL (ref 0.44–1.00)
Calcium: 8.1 mg/dL — ABNORMAL LOW (ref 8.9–10.3)
Creatinine, Ser: 0.55 mg/dL (ref 0.44–1.00)
GFR calc Af Amer: >60 mL/min (ref >60)
Glucose, Bld: 118 mg/dL — ABNORMAL HIGH (ref 65–99)
Glucose, Bld: 153 mg/dL — ABNORMAL HIGH (ref 65–99)
POTASSIUM: 4.2 mmol/L (ref 3.5–5.1)
Potassium: 3.9 mmol/L (ref 3.5–5.1)
Sodium: 134 mmol/L — ABNORMAL LOW (ref 135–145)
Sodium: 138 mmol/L (ref 135–145)
Total Bilirubin: 0.6 mg/dL (ref 0.3–1.2)
Total Protein: 6.5 g/dL (ref 6.5–8.1)
Total Protein: 6.4 g/dL — ABNORMAL LOW (ref 6.5–8.1)

## 2015-06-30 LAB — GLUCOSE, CAPILLARY
GLUCOSE-CAPILLARY: 113 mg/dL — AB (ref 65–99)
Glucose-Capillary: 139 mg/dL — ABNORMAL HIGH (ref 65–99)
Glucose-Capillary: 141 mg/dL — ABNORMAL HIGH (ref 65–99)

## 2015-06-30 LAB — LACTATE DEHYDROGENASE: LDH: 341 U/L — ABNORMAL HIGH (ref 98–192)

## 2015-06-30 LAB — RPR: RPR: NONREACTIVE

## 2015-06-30 MED ORDER — LACTATED RINGERS IV SOLN
INTRAVENOUS | Status: DC
  Administered 2015-06-30: 23:00:00 via INTRAVENOUS

## 2015-06-30 MED ORDER — MAGNESIUM SULFATE 50 % IJ SOLN
2.0000 g/h | INTRAVENOUS | Status: DC
  Administered 2015-06-30: 2 g/h via INTRAVENOUS
  Filled 2015-06-30: qty 80

## 2015-06-30 NOTE — Progress Notes (Signed)
Patient set up with double electric breast pump. She was instructed on use.

## 2015-06-30 NOTE — Lactation Note (Signed)
This note was copied from the chart of Lorraine Gibson. Lactation Consultation Note  Initial visit made.  Breastfeeding consultation services and support information given and reviewed.  Providing Breastmilk For Your Baby in NICU book also given and reviewed.  Mom has pumped once but no colostrum obtained.  Instructed on hand expression and patient did well.  No colostrum visible.  Mom states breasts started feeling full recently.  Mom has large breasts and some tissue swelling noted.  Recommended wearing a bra for support.  Encouraged to call for assist/concerns prn.  Patient Name: Lorraine Gibson Today's Date: 06/30/2015 Reason for consult: Initial assessment;NICU baby;Infant < 6lbs   Maternal Data Has patient been taught Hand Expression?: Yes Does the patient have breastfeeding experience prior to this delivery?: Yes  Feeding    LATCH Score/Interventions                      Lactation Tools Discussed/Used Pump Review: Setup, frequency, and cleaning;Milk Storage Initiated by:: RN Date initiated:: 06/30/15   Consult Status Consult Status: Follow-up Date: 07/01/15 Follow-up type: In-patient    Ave Filter 06/30/2015, 10:33 AM

## 2015-06-30 NOTE — Progress Notes (Signed)
CSW acknowledges NICU admission.    Patient screened out for psychosocial assessment since none of the following apply:  Psychosocial stressors documented in mother or baby's chart  Gestation less than 32 weeks  Code at delivery   Infant with anomalies  Please contact the Clinical Social Worker if specific needs arise, or by MOB's request.       

## 2015-06-30 NOTE — Progress Notes (Signed)
   06/29/15 2128  Vitals  BP (!) 176/99 mmHg  BP Location Right Arm  BP Method Automatic  Patient Position (if appropriate) Lying  Pulse Rate 67  Pulse Rate Source Monitor  Resp 18  Oxygen Therapy  SpO2 99 %  O2 Device Room Air  MD called and informed of elevated B/P. Order received for Labetalol 20 mg IV x 1. Order carried out. We will continue to monitor.

## 2015-06-30 NOTE — Progress Notes (Addendum)
POD # 1/AICU Day #1  Subjective: Pt reports feeling well/ Pain controlled with long acting spinal narcotic and Motrin Tolerating po/ Foley in place/ No n/v/ Flatus absent No HA, visual disturbances, or epigastric pain Activity: up with assistance Bleeding is light Newborn info:  Information for the patient's newborn:  Tashera, Ruth Girl Garrie [id number]  female   NICU/ Feeding: plans to pump  Objective:  VS:  Filed Vitals:   06/30/15 0833 06/30/15 0838 06/30/15 0902 06/30/15 1031  BP:   161/91 139/90  Pulse:   75 79  Temp:      TempSrc:      Resp:    16  Height:      Weight:      SpO2: 97% 100%       I&O: Intake/Output      01/08 0701 - 01/09 0700 01/09 0701 - 01/10 0700   P.O. 416    I.V. (mL/kg) 3179.2 (28.1)    Total Intake(mL/kg) 3595.2 (31.8)    Urine (mL/kg/hr) 3600 275 (0.6)   Stool 1    Blood 700    Total Output 4301 275   Net -705.8 -275           Recent Labs  06/29/15 1420 06/30/15 0645  WBC 6.7 14.7*  HGB 10.6* 10.8*  HCT 32.7* 32.9*  PLT 251 236    Blood type: --/--/A POS, A POS (01/08 1420) Rubella:   Immune   Physical Exam:  General: alert, cooperative and no distress CV: Regular rate and rhythm Resp: CTA bilaterally Abdomen: soft, nontender, normal bowel sounds Incision: Covered with Tegaderm and honeycomb dressing; no significant drainage, edema, bruising, or erythema; well approximated with suture. Uterine Fundus: firm, below umbilicus, nontender Lochia: minimal GU: Foley to SD, clear, abundant Ext: edema 1+ BLE and Homans sign is negative, no sign of DVT   Assessment: POD # 1/ G2P0201/ S/P C/Section d/t abdominal cerclage, severe PEC  Severe Preeclampsia, delivered-labs stable, good diuresis. BPs stable but still labile Anemia stable GDM -stable. BS 113  Plan: Continue Magnesium Sulfate-consider d/c in tonight vs am Strict I/O Increased Labetalol to 200mg  tid with am dose    Signed: Julianne Handler, N, MSN,  CNM 06/30/2015, 11:22 AM

## 2015-06-30 NOTE — Anesthesia Postprocedure Evaluation (Signed)
Anesthesia Post Note  Patient: Lorraine Gibson  Procedure(s) Performed: Procedure(s) (LRB): CESAREAN SECTION (N/A)  Patient location during evaluation: Antenatal Anesthesia Type: Spinal Level of consciousness: awake and alert Pain management: pain level controlled Vital Signs Assessment: post-procedure vital signs reviewed and stable Respiratory status: spontaneous breathing Cardiovascular status: stable Postop Assessment: no headache, no backache, adequate PO intake and no signs of nausea or vomiting Anesthetic complications: no    Last Vitals:  Filed Vitals:   06/30/15 0748 06/30/15 0750  BP:  139/89  Pulse: 68 77  Temp:  36.6 C  Resp:  18    Last Pain:  Filed Vitals:   06/30/15 0755  PainSc: 0-No pain                 Mada Sadik Hristova

## 2015-06-30 NOTE — Addendum Note (Signed)
Addendum  created 06/30/15 [id number] by Hewitt Blade, CRNA   Modules edited: Clinical Notes   Clinical Notes:  File: [id number]

## 2015-07-01 LAB — CBC
HEMATOCRIT: 27.3 % — AB (ref 36.0–46.0)
HEMOGLOBIN: 8.7 g/dL — AB (ref 12.0–15.0)
MCH: 28.2 pg (ref 26.0–34.0)
MCHC: 31.9 g/dL (ref 30.0–36.0)
MCV: 88.6 fL (ref 78.0–100.0)
Platelets: 210 10*3/uL (ref 150–400)
RBC: 3.08 MIL/uL — ABNORMAL LOW (ref 3.87–5.11)
RDW: 17.3 % — AB (ref 11.5–15.5)
WBC: 10.6 10*3/uL — AB (ref 4.0–10.5)

## 2015-07-01 LAB — URIC ACID: Uric Acid, Serum: 3.8 mg/dL (ref 2.3–6.6)

## 2015-07-01 LAB — COMPREHENSIVE METABOLIC PANEL
ALBUMIN: 2.7 g/dL — AB (ref 3.5–5.0)
ALK PHOS: 55 U/L (ref 38–126)
ALT: 14 U/L (ref 14–54)
ANION GAP: 8 (ref 5–15)
AST: 20 U/L (ref 15–41)
BUN: 8 mg/dL (ref 6–20)
CO2: 24 mmol/L (ref 22–32)
Calcium: 7.3 mg/dL — ABNORMAL LOW (ref 8.9–10.3)
Chloride: 105 mmol/L (ref 101–111)
Creatinine, Ser: 0.52 mg/dL (ref 0.44–1.00)
GFR calc Af Amer: >60 mL/min (ref >60)
GFR calc non Af Amer: >60 mL/min (ref >60)
GLUCOSE: 98 mg/dL (ref 65–99)
POTASSIUM: 3.6 mmol/L (ref 3.5–5.1)
SODIUM: 137 mmol/L (ref 135–145)
Total Bilirubin: 0.2 mg/dL — ABNORMAL LOW (ref 0.3–1.2)
Total Protein: 5.7 g/dL — ABNORMAL LOW (ref 6.5–8.1)

## 2015-07-01 LAB — MAGNESIUM: MAGNESIUM: 4 mg/dL — AB (ref 1.7–2.4)

## 2015-07-01 LAB — GLUCOSE, CAPILLARY
GLUCOSE-CAPILLARY: 82 mg/dL (ref 65–99)
GLUCOSE-CAPILLARY: 128 mg/dL — AB (ref 65–99)
Glucose-Capillary: 69 mg/dL (ref 65–99)

## 2015-07-01 LAB — LACTATE DEHYDROGENASE: LDH: 334 U/L — ABNORMAL HIGH (ref 98–192)

## 2015-07-01 MED ORDER — POLYSACCHARIDE IRON COMPLEX 150 MG PO CAPS
150.0000 mg | ORAL_CAPSULE | Freq: Two times a day (BID) | ORAL | Status: DC
  Administered 2015-07-01 – 2015-07-03 (×5): 150 mg via ORAL
  Filled 2015-07-01 (×7): qty 1

## 2015-07-01 MED ORDER — MAGNESIUM OXIDE 400 (241.3 MG) MG PO TABS
200.0000 mg | ORAL_TABLET | Freq: Every day | ORAL | Status: DC
  Administered 2015-07-01 – 2015-07-02 (×2): 200 mg via ORAL
  Filled 2015-07-01 (×2): qty 0.5

## 2015-07-01 MED ORDER — FLEET ENEMA 7-19 GM/118ML RE ENEM
1.0000 | ENEMA | Freq: Once | RECTAL | Status: AC
  Administered 2015-07-01: 1 via RECTAL

## 2015-07-01 NOTE — Progress Notes (Addendum)
POD # 2 / AICU Day #2  Subjective: Pt reports feeling well / Pain controlled with ibuprofen (OTC) and narcotic analgesics including Percocet  NPO since last night - ice chips only / urinating without difficulty / No n/v/ Flatus absent / No BM - feels like something is there, but unable to evacuate and stool with several attempts in the BR - requests an enema for relief No HA, visual disturbances, or epigastric pain Activity: up with assistance Bleeding is light  Newborn info:  Information for the patient's newborn:  Jullisa, Naatz Girl Shatara [id number]  female    Infant in NICU / Feeding: attempted pumping with little success  Objective:  VS:  Filed Vitals:   07/01/15 0606 07/01/15 0700 07/01/15 0802 07/01/15 0816  BP: 165/91 155/81 155/86 151/90  Pulse: 75 73 76 74  Temp:    98.2 F (36.8 C)  TempSrc:    Oral  Resp: 18 18  16   Height:      Weight:      SpO2: 100%        I&O: Intake/Output      01/09 0701 - 01/10 0700 01/10 0701 - 01/11 0700   P.O. 1540    I.V. (mL/kg) 2454.2 (21.7)    Total Intake(mL/kg) 3994.2 (35.3)    Urine (mL/kg/hr) 3350 (1.2)    Stool     Blood     Total Output 3350     Net +644.2             Recent Labs  06/30/15 2240 07/01/15 0655  WBC 11.5* 10.6*  HGB 9.4* 8.7*  HCT 29.4* 27.3*  PLT 214 210    Blood type: A POS (01/08 1420) Rubella: Immune   Physical Exam:  General: alert, cooperative and no distress CV: Regular rate and rhythm Resp: CTA bilaterally Abdomen: soft, nontender, normal bowel sounds Incision: Covered with Tegaderm and honeycomb dressing; no significant drainage, edema, bruising, or erythema; well approximated with suture. Uterine Fundus: firm, FH even with umbilicus, nontender Lochia: minimal GU: good output, clear urine per RN Ext: edema 1+ BLE and Homans sign is negative, no sign of DVT   Assessment: POD # 2 / [id number] / S/P C/Section d/t abdominal cerclage, severe PEC  Severe Preeclampsia, delivered-labs  stable, good diuresis. BPs stable but still labile Anemia stable GDM -stable. BS 62 Acute Blood Loss Anemia Constipation  Plan: D/C Magnesium Sulfate Strict I/O Continue Labetalol to 200mg  tid  D/C NPO orders Fleets enema Start Niferex 150 mg BID and Magnesium Oxide 200 mg daily Encouraged to pump every 2 hours to stimulate colostrum and breast milk   *Consult with Dr. Ronita Hipps with lab results, orders to d/c magnesium sulfate infusion and for fleet enema  Signed: Graceann Congress, MSN, CNM 07/01/2015, 9:27 AM  Patient seen, chart reviewed.  Agree with above management plan.  Dr Princess Bruins  07/01/2015 at 10:54 am

## 2015-07-01 NOTE — Lactation Note (Signed)
This note was copied from the chart of Lorraine Gibson. Lactation Consultation Note  Patient Name: Lorraine Gibson Today's Date: 07/01/2015 Reason for consult: Follow-up assessment  NICU baby 46 hours old. Mom reports that she has not pumped since yesterday. Discussed the normal progression of milk coming to volume and the need to pump at least 8 times/24 hours for 15 minutes followed by hand expression. Assisted mom to start pumping again. Demonstrated hand expression without colostrum visible. Mom states that she has not seen any colostrum yet. Mom had a low supply with first child, also preterm. Mom asking about supplements. Mom given galactagogue handouts for Moringa, Fenugreek, and also Lactation/Oatmeal cookie recipe with review. Stressed the primary importance of pumping, and galactagogues as secondary. Enc mom to provide STS/Kangaroo care with baby as able.   Mom states that she has an [employer] breast pump at home. Discussed the benefits of a hospital grade pump and mom aware of [place] 2-week DEBP rental. Mom aware of pumping rooms in NICU as well.  Maternal Data    Feeding Feeding Type: Formula Nipple Type: Slow - flow Length of feed: 10 min  LATCH Score/Interventions                      Lactation Tools Discussed/Used     Consult Status Consult Status: Follow-up Date: 07/02/15 Follow-up type: In-patient    Inocente Salles 07/01/2015, 1:17 PM

## 2015-07-02 LAB — GLUCOSE, CAPILLARY
GLUCOSE-CAPILLARY: 106 mg/dL — AB (ref 65–99)
Glucose-Capillary: 127 mg/dL — ABNORMAL HIGH (ref 65–99)
Glucose-Capillary: 81 mg/dL (ref 65–99)
Glucose-Capillary: 111 mg/dL — ABNORMAL HIGH (ref 65–99)

## 2015-07-02 MED ORDER — HYDROCHLOROTHIAZIDE 12.5 MG PO CAPS
12.5000 mg | ORAL_CAPSULE | Freq: Every day | ORAL | Status: DC
  Administered 2015-07-02 – 2015-07-03 (×2): 12.5 mg via ORAL
  Filled 2015-07-02 (×3): qty 1

## 2015-07-02 MED ORDER — MAGNESIUM OXIDE 400 (241.3 MG) MG PO TABS
400.0000 mg | ORAL_TABLET | Freq: Every day | ORAL | Status: DC
  Administered 2015-07-03: 400 mg via ORAL
  Filled 2015-07-02 (×2): qty 1

## 2015-07-02 MED ORDER — TRAMADOL HCL 50 MG PO TABS
50.0000 mg | ORAL_TABLET | Freq: Four times a day (QID) | ORAL | Status: DC | PRN
  Administered 2015-07-02: 50 mg via ORAL
  Filled 2015-07-02: qty 1

## 2015-07-02 MED ORDER — IBUPROFEN 800 MG PO TABS
800.0000 mg | ORAL_TABLET | Freq: Three times a day (TID) | ORAL | Status: DC
Start: 2015-07-02 — End: 2015-07-03
  Administered 2015-07-02 – 2015-07-03 (×4): 800 mg via ORAL
  Filled 2015-07-02 (×4): qty 1

## 2015-07-02 MED ORDER — LABETALOL HCL 200 MG PO TABS
300.0000 mg | ORAL_TABLET | Freq: Three times a day (TID) | ORAL | Status: DC
  Administered 2015-07-02 – 2015-07-03 (×3): 300 mg via ORAL
  Filled 2015-07-02 (×3): qty 1

## 2015-07-02 NOTE — Progress Notes (Addendum)
POSTOPERATIVE DAY # 3 S/P CS- severe preeclampsia   S:         Reports feeling really sore with incisional pain             No PIH symptoms except worsening edema past 24 hr             Tolerating po intake / no nausea / no vomiting / + flatus / +BM             Bleeding is light             Pain not well-controlled withmotrin and percocet             Up ad lib / ambulatory/ voiding QS  Newborn stable in NICU - started bottle feeding today    O:  VS: BP 175/90 mmHg  Pulse 81  Temp(Src) 98.4 F (36.9 C) (Oral)  Resp 18  Ht 5' 7.5" (1.715 m)  Wt 112.991 kg (249 lb 1.6 oz)  BMI 38.42 kg/m2  SpO2 99%  Breastfeeding? Unknown   BP: 150/97 - 175/90 - 162/84 - 170/90 - 182/103 - 153/99   LABS:               Recent Labs  06/30/15 2240 07/01/15 0655  WBC 11.5* 10.6*  HGB 9.4* 8.7*  PLT 214 210                                           I&O: Not measured             Physical Exam:             Alert and Oriented X3  Lungs: Clear and unlabored  Heart: regular rate and rhythm / no mumurs  Abdomen: soft, non-tender, non-distended, active BS             Fundus: firm, non-tender, Ueven             Dressing intact honeycomb              Incision:  approximated with suture / no erythema / no ecchymosis / no drainage  Perineum: intact  Lochia: light  Extremities: 2+ edema, no calf pain or tenderness, negative Homans  A:        POD # 3 S/P CS             IDA with compounded blood loss            Severe pre-eclampsia - BP improved but remains labile / worsening edema            GDM / pre-existing glucose intolerance - glucose stable PP             Pain uncontrolled    P:        Routine postoperative care              Continue iron and increase magnesium to prevent constipation - increase water intake             Add tramadol for pain PRN between percocet for break-thru pain / Motrin dose increased             Add HCTZ 12.5 for dependent edema             Anticipate DC home  tomorrow   Artelia Laroche CNM, MSN, [facility] 07/02/2015, 11:42 AM  07/02/2015 @ 1300 Update from Dr Ronita Hipps - recommend increase in Labetalol 300mg  TID Monitor & reassess for [place]

## 2015-07-02 NOTE — Lactation Note (Signed)
This note was copied from the chart of Lorraine Gibson. Lactation Consultation Note  Follow up visit.  Mom is excited she obtained a small amount of milk this AM.  Encouraged to continue to pump every three hours followed by hand expression.  She has an [place] at home.  Recommended a lot of skin to skin and nuzzling at breast.  Patient Name: Lorraine Gibson Today's Date: 07/02/2015     Maternal Data    Feeding Feeding Type: Bottle Fed - Formula Nipple Type: Slow - flow Length of feed: 15 min  LATCH Score/Interventions                      Lactation Tools Discussed/Used     Consult Status      Ave Filter 07/02/2015, 9:41 AM

## 2015-07-03 LAB — GLUCOSE, CAPILLARY
Glucose-Capillary: 77 mg/dL (ref 65–99)
Glucose-Capillary: 82 mg/dL (ref 65–99)

## 2015-07-03 MED ORDER — LABETALOL HCL 300 MG PO TABS: 300.0000 mg | ORAL_TABLET | Freq: Three times a day (TID) | ORAL | Status: DC

## 2015-07-03 MED ORDER — IBUPROFEN 800 MG PO TABS: 800.0000 mg | ORAL_TABLET | Freq: Three times a day (TID) | ORAL | Status: DC

## 2015-07-03 MED ORDER — OXYCODONE-ACETAMINOPHEN 5-325 MG PO TABS: 1.0000 | ORAL_TABLET | ORAL | Status: DC | PRN

## 2015-07-03 NOTE — Progress Notes (Signed)
Pt is discharged in the care of husband D.ownstairs per ambulatory with R.N. Judd Lien. Spirits are good. Denies pain or discomfort. Blood pressure is  At 1700p.m. Is 138 /90. Discharged instructions were given to pt. Questions were asked and answered. Stable   Infant to remain in [place].

## 2015-07-03 NOTE — Lactation Note (Signed)
This note was copied from the chart of Lorraine Dodi Beagley. Lactation Consultation Note  Assisted mom with breastfeeding.  She has attempted twice but baby not latching.  Mom just pumped 10 mls of transitional milk.  Breasts are large and need support with rolled cloth.  Nipples short.  Baby very sleepy with at this feeding.  She would open her mouth slightly and hold nipple in her mouth.  20 mm nipple shield applied with no improvement in suckling.  Discussed normal preterm feeding patterns.  Stressed importance of establishing and maintaining milk supply as baby matures.  Patient Name: Lorraine Gibson Today's Date: 07/03/2015     Maternal Data    Feeding Feeding Type: Breast Milk with Formula added Length of feed: 30 min  LATCH Score/Interventions Latch: Repeated attempts needed to sustain latch, nipple held in mouth throughout feeding, stimulation needed to elicit sucking reflex.  Audible Swallowing: None  Type of Nipple: Flat  Comfort (Breast/Nipple): Filling, red/small blisters or bruises, mild/mod discomfort     Hold (Positioning): Assistance needed to correctly position infant at breast and maintain latch.  LATCH Score: 4  Lactation Tools Discussed/Used     Consult Status      Ave Filter 07/03/2015, 12:35 PM

## 2015-07-03 NOTE — Discharge Summary (Signed)
OB Discharge Summary  Patient Name: Lorraine Gibson DOB: [birth year] MRN: [id number]  Date of admission: 06/29/2015 Delivering MD: Brien Few   Date of discharge: 07/03/2015  Admitting diagnosis: 35wks,bleeding 35 weeks / preeclampsia / abdominal cerclage / third trimester bleeding Intrauterine pregnancy: [redacted]w[redacted]d     Secondary diagnosis:Principal Problem:   Postpartum care following cesarean delivery (1/8) Active Problems:   Third trimester bleeding  Additional problems:sever PEC     Discharge diagnosis: s/p PCS, severe PEC                                                                     Post partum procedures:none  Augmentation: planned PCS  Complications: None  Hospital course:  Sceduled C/S   35 y.o. yo G2P0201 at [redacted]w[redacted]d was admitted to the hospital 06/29/2015 for scheduled cesarean section with the following indication:pt planning pCS for abdominal cerlcage, PCS done on admission due to svere range bps and vaginal bleeding.  Membrane Rupture Time/Date: 6:03 PM ,06/29/2015   Patient delivered a Viable infant.06/29/2015  Details of operation can be found in separate operative note.  Pateint had an uncomplicated postpartum course.  She is ambulating, tolerating a regular diet, passing flatus, and urinating well. Patient is discharged home in stable condition on  07/03/2015   On POD#4 pt notes nl appetite, tol po, walking with some cramping,  +flatus, + BM. Increase in vaginal bleeding with pumping. Pain controlled with po meds. Pt does note palpable fibroid. No HA, no vision change, no RUQ pain.           Physical exam  Filed Vitals:   07/02/15 2144 07/03/15 0500 07/03/15 0632 07/03/15 1046  BP: 150/90 160/80  140/80  Pulse: 78 65  80  Temp: 98.4 F (36.9 C) 98.5 F (36.9 C)  98.2 F (36.8 C)  TempSrc: Oral Oral  Oral  Resp: 20 20  18   Height:      Weight:   108.523 kg (239 lb 4 oz)   SpO2: 99% 100%  100%   General: alert Lochia: appropriate Uterine  Fundus: firm, fundal fibroid palpable Incision: Dressing is clean, dry, and intact DVT Evaluation: No evidence of DVT seen on physical exam. 2+ LE edema  Labs: Lab Results  Component Value Date   WBC 10.6* 07/01/2015   HGB 8.7* 07/01/2015   HCT 27.3* 07/01/2015   MCV 88.6 07/01/2015   PLT 210 07/01/2015   CMP Latest Ref Rng 07/01/2015  Glucose 65 - 99 mg/dL 98  BUN 6 - 20 mg/dL 8  Creatinine 0.44 - 1.00 mg/dL 0.52  Sodium 135 - 145 mmol/L 137  Potassium 3.5 - 5.1 mmol/L 3.6  Chloride 101 - 111 mmol/L 105  CO2 22 - 32 mmol/L 24  Calcium 8.9 - 10.3 mg/dL 7.3(L)  Total Protein 6.5 - 8.1 g/dL 5.7(L)  Total Bilirubin 0.3 - 1.2 mg/dL 0.2(L)  Alkaline Phos 38 - 126 U/L 55  AST 15 - 41 U/L 20  ALT 14 - 54 U/L 14    Discharge instruction: per After Visit Summary and "Baby and Me Booklet".  After Visit Meds:    Medication List    TAKE these medications        aspirin 81 MG  tablet  Take 81 mg by mouth daily.     calcium carbonate 500 MG chewable tablet  Commonly known as:  TUMS - dosed in mg elemental calcium  Chew 1 tablet by mouth 2 (two) times daily as needed for indigestion or heartburn.     FERRALET 90 PO  Take 1 tablet by mouth daily.     ibuprofen 800 MG tablet  Commonly known as:  ADVIL,MOTRIN  Take 1 tablet (800 mg total) by mouth every 8 (eight) hours.     labetalol 100 MG tablet  Commonly known as:  NORMODYNE  Take 100 mg by mouth 3 (three) times daily.     labetalol 300 MG tablet  Commonly known as:  NORMODYNE  Take 1 tablet (300 mg total) by mouth 3 (three) times daily.     oxyCODONE-acetaminophen 5-325 MG tablet  Commonly known as:  PERCOCET/ROXICET  Take 1-2 tablets by mouth every 4 (four) hours as needed for severe pain (pain scale 4-7).     prenatal multivitamin Tabs tablet  Take 1 tablet by mouth daily at 12 noon.        Diet: carb modified diet  Activity: Advance as tolerated. Pelvic rest for 6 weeks.   Outpatient follow [id number] next  wk Follow up Appt:Future Appointments Date Time Provider [place]  07/09/2015 3:15 PM [facility] PAT 2 [facility] None   Follow up visit: No Follow-up on file.  Postpartum contraception: undecided, planning hysterectomy at some point  Newborn Data: Live born female  Birth Weight: 3 lb 15.5 oz (1800 g) APGAR: 8, 8  Baby Feeding: Bottle and Breast Disposition:NICU   07/03/2015 Ala Dach., MD

## 2015-07-09 ENCOUNTER — Inpatient Hospital Stay (HOSPITAL_COMMUNITY)
Admission: RE | Admit: 2015-07-09 | Discharge: 2015-07-09 | Disposition: A | Discharge status: Home / Self Care | Payer: Federal, State, Local not specified - PPO | Source: Ambulatory Visit

## 2015-07-11 ENCOUNTER — Encounter (HOSPITAL_COMMUNITY): Admission: RE | Payer: Self-pay | Source: Ambulatory Visit

## 2015-07-11 ENCOUNTER — Inpatient Hospital Stay (HOSPITAL_COMMUNITY)
Admission: RE | Admit: 2015-07-11 | Payer: Federal, State, Local not specified - PPO | Source: Ambulatory Visit | Admitting: Obstetrics

## 2015-07-11 SURGERY — Surgical Case
Anesthesia: Spinal

## 2015-10-11 DIAGNOSIS — I10 Essential (primary) hypertension: Secondary | ICD-10-CM | POA: Insufficient documentation

## 2016-12-24 ENCOUNTER — Encounter: Payer: Self-pay | Admitting: Emergency Medicine

## 2016-12-24 ENCOUNTER — Emergency Department
Admission: EM | Admit: 2016-12-24 | Discharge: 2016-12-24 | Disposition: A | Discharge status: Home / Self Care | Payer: Federal, State, Local not specified - PPO | Attending: Emergency Medicine | Admitting: Emergency Medicine

## 2016-12-24 ENCOUNTER — Emergency Department: Payer: Federal, State, Local not specified - PPO

## 2016-12-24 DIAGNOSIS — W2203XA Walked into furniture, initial encounter: Secondary | ICD-10-CM | POA: Insufficient documentation

## 2016-12-24 DIAGNOSIS — Z7982 Long term (current) use of aspirin: Secondary | ICD-10-CM | POA: Diagnosis not present

## 2016-12-24 DIAGNOSIS — I10 Essential (primary) hypertension: Secondary | ICD-10-CM | POA: Diagnosis not present

## 2016-12-24 DIAGNOSIS — Y999 Unspecified external cause status: Secondary | ICD-10-CM | POA: Diagnosis not present

## 2016-12-24 DIAGNOSIS — Z79899 Other long term (current) drug therapy: Secondary | ICD-10-CM | POA: Insufficient documentation

## 2016-12-24 DIAGNOSIS — Y929 Unspecified place or not applicable: Secondary | ICD-10-CM | POA: Insufficient documentation

## 2016-12-24 DIAGNOSIS — S99921A Unspecified injury of right foot, initial encounter: Secondary | ICD-10-CM | POA: Diagnosis present

## 2016-12-24 DIAGNOSIS — S92521A Displaced fracture of medial phalanx of right lesser toe(s), initial encounter for closed fracture: Secondary | ICD-10-CM | POA: Insufficient documentation

## 2016-12-24 DIAGNOSIS — S92501A Displaced unspecified fracture of right lesser toe(s), initial encounter for closed fracture: Secondary | ICD-10-CM

## 2016-12-24 DIAGNOSIS — Y939 Activity, unspecified: Secondary | ICD-10-CM | POA: Diagnosis not present

## 2016-12-24 DIAGNOSIS — Z791 Long term (current) use of non-steroidal anti-inflammatories (NSAID): Secondary | ICD-10-CM | POA: Diagnosis not present

## 2016-12-24 MED ORDER — TRAMADOL HCL 50 MG PO TABS: 50.0000 mg | ORAL_TABLET | Freq: Four times a day (QID) | ORAL | 0 refills | Status: AC | PRN

## 2016-12-24 NOTE — ED Provider Notes (Signed)
[facility] Emergency Department Provider Note   ____________________________________________   I have reviewed the triage vital signs and the nursing notes.   HISTORY  Chief Complaint Toe Injury    HPI Lorraine Gibson is a 36 y.o. female presents with right fourth toe pain after hitting her toe on the corner of a desk approximately 30 minutes prior prior to arrival. Patient notes no deformity and reports intact movement and sensation although painful. Patient arrived ambulatory. Patient denies fever, chills, headache, vision changes, chest pain, chest tightness, shortness of breath, abdominal pain, nausea and vomiting.  Past Medical History:  Diagnosis Date  . Anemia   . Gestational diabetes   . Gestational diabetes mellitus (GDM), antepartum   . Headache   . Hx of cerclage, currently pregnant   . Hypertension   . Vaginal Pap smear, abnormal     Patient Active Problem List   Diagnosis Date Noted  . Postpartum care following cesarean delivery (1/8) 06/30/2015  . Third trimester bleeding 06/29/2015    Past Surgical History:  Procedure Laterality Date  . ANKLE SURGERY    . CERVICAL CERCLAGE    . CESAREAN SECTION    . CESAREAN SECTION N/A 06/29/2015   Procedure: CESAREAN SECTION;  Surgeon: Princess Bruins, MD;  Location: [facility];  Service: Obstetrics;  Laterality: N/A;  Most likely surgery after %PM - will be Dr Ronita Hipps instead of [place] ATE full meal 1400  . FOOT SURGERY    . LEEP      Prior to Admission medications   Medication Sig Start Date End Date Taking? Authorizing Provider  aspirin 81 MG tablet Take 81 mg by mouth daily.    [provider]  calcium carbonate (TUMS - DOSED IN MG ELEMENTAL CALCIUM) 500 MG chewable tablet Chew 1 tablet by mouth 2 (two) times daily as needed for indigestion or heartburn.    [provider]  Fe Cbn-Fe Gluc-FA-B12-C-DSS (FERRALET 90 PO) Take 1 tablet by mouth daily.    [provider]  ibuprofen (ADVIL,MOTRIN) 800 MG tablet Take 1 tablet (800 mg total) by mouth every 8 (eight) hours. 07/03/15   Aloha Gell, MD  labetalol (NORMODYNE) 100 MG tablet Take 100 mg by mouth 3 (three) times daily.    [provider]  labetalol (NORMODYNE) 300 MG tablet Take 1 tablet (300 mg total) by mouth 3 (three) times daily. 07/03/15   Aloha Gell, MD  oxyCODONE-acetaminophen (PERCOCET/ROXICET) 5-325 MG tablet Take 1-2 tablets by mouth every 4 (four) hours as needed for severe pain (pain scale 4-7). 07/03/15   Aloha Gell, MD  Prenatal Vit-Fe Fumarate-FA (PRENATAL MULTIVITAMIN) TABS tablet Take 1 tablet by mouth daily at 12 noon.    [provider]  traMADol (ULTRAM) 50 MG tablet Take 1 tablet (50 mg total) by mouth every 6 (six) hours as needed. 12/24/16 12/24/17  Lusero Nordlund, Laroy Apple, PA-C    Allergies Patient has no known allergies.  Family History  Problem Relation Age of Onset  . Hypertension Mother   . Cancer Mother   . Hypertension Father   . Kidney disease Father   . Heart disease Father   . Cancer Father   . Hypertension Brother   . Diabetes Brother   . Kidney disease Brother     Social History Social History  Substance Use Topics  . Smoking status: Never Smoker  . Smokeless tobacco: Never Used  . Alcohol use No    Review of Systems Constitutional: Negative for fever/chills  Eyes: No visual changes. Cardiovascular: Denies chest pain. Respiratory: Denies cough Denies shortness of breath. Musculoskeletal: Positive for right toe pain. Skin: Negative for rash. Neurological: Negative for headaches. Able to ambulate. ____________________________________________   PHYSICAL EXAM:  VITAL SIGNS: ED Triage Vitals  Enc Vitals Group     BP 12/24/16 2117 (!) 142/103     Pulse Rate 12/24/16 2117 79     Resp 12/24/16 2114 16     Temp 12/24/16 2117 98.3 F (36.8 C)     Temp Source 12/24/16 2114 Oral     SpO2 12/24/16 2117 100 %     Weight 12/24/16  2114 234 lb (106.1 kg)     Height 12/24/16 2114 5\' 7"  (1.702 m)     Head Circumference --      Peak Flow --      Pain Score 12/24/16 2113 6     Pain Loc --      Pain Edu? --      Excl. in [place]? --     Constitutional: Alert and oriented. Well appearing and in no acute distress.  Head: Normocephalic and atraumatic. Eyes: Conjunctivae are normal. PERRL. Cardiovascular: Normal rate, regular rhythm. Normal distal pulses. Respiratory: Normal respiratory effort.  Musculoskeletal: Right fourth toe pain after traumatic injury. No visible deformities. Movement and sensation intact with pain. Otherwise, nontender with normal range of motion in all extremities. Neurologic: Normal speech and language.  Skin:  Skin is warm, dry and intact. No rash noted. Psychiatric: Mood and affect are normal.  ____________________________________________   LABS (all labs ordered are listed, but only abnormal results are displayed)  Labs Reviewed - No data to display ____________________________________________  EKG None ____________________________________________  RADIOLOGY DG toe 4th right  FINDINGS: There is a mildly displaced oblique fracture through the fourth proximal phalanx. No significant soft tissue abnormalities are characterized. Visualized joint spaces are preserved.  IMPRESSION: Mildly displaced oblique fracture through the fourth proximal phalanx. ____________________________________________   PROCEDURES  Procedure(s) performed: SPLINT APPLICATION Date/Time: 12:45 PM Authorized by: Jerolyn Shin Consent: Verbal consent obtained. Risks and benefits: risks, benefits and alternatives were discussed Consent given by: patient Splint applied by: EMT technician Location details: Right fourth toe  Splint type: Buddy tape with cast shoe  Supplies used: Athletic tape, cotton padding, cast shoe Post-procedure: The splinted body part was neurovascularly unchanged following the  procedure. Patient tolerance: Patient tolerated the procedure well with no immediate complications.  Initial fracture care was provided. Follow up will be greater than 24 hours.    Critical Care performed: no ____________________________________________   INITIAL IMPRESSION / ASSESSMENT AND PLAN / ED COURSE  Pertinent labs & imaging results that were available during my care of the patient were reviewed by me and considered in my medical decision making (see chart for details).  Patient presented with right fourth toe pain secondary to traumatic injury. Patient history, physical exam and imaging findings are consistent with mildly displaced fourth toe proximal phalanx fracture. Patient's toe stabilized with buddy taping and supported with a cast shoe. Extremity was neurovascularly intact before and after splinting. Patient was advised to follow up with podiatry as needed and was also advised to return to the emergency department for symptoms that change or worsen. Patient informed of clinical course, understand medical decision-making process, and agree with plan.      If controlled substance prescribed during this visit, 12 month history viewed on the [place] prior to issuing an initial prescription for Schedule II or III opiod.  ____________________________________________   FINAL CLINICAL IMPRESSION(S) / ED DIAGNOSES  Final diagnoses:  Closed fracture of phalanx of right fourth toe, initial encounter       NEW MEDICATIONS STARTED DURING THIS VISIT:  Discharge Medication List as of 12/24/2016 10:17 PM    START taking these medications   Details  traMADol (ULTRAM) 50 MG tablet Take 1 tablet (50 mg total) by mouth every 6 (six) hours as needed., Starting Fri 12/24/2016, Until Sat 12/24/2017, Print         Note:  This document was prepared using Dragon voice recognition software and may include unintentional dictation errors.    Kynan Peasley, Fleet Contras 12/24/16 2234      Nance Pear, MD 12/24/16 2242

## 2016-12-24 NOTE — ED Notes (Signed)
Pt taken to xray 

## 2016-12-24 NOTE — ED Triage Notes (Signed)
Pt states struck righ t4th toe on corner of desk approx 30 min pta. Pt without obvious deformity noted, pt has buddy taped toes pta.

## 2017-03-14 ENCOUNTER — Other Ambulatory Visit: Payer: Self-pay | Admitting: Gastroenterology

## 2017-03-14 DIAGNOSIS — R1033 Periumbilical pain: Secondary | ICD-10-CM

## 2017-03-14 DIAGNOSIS — R1084 Generalized abdominal pain: Secondary | ICD-10-CM

## 2017-03-22 ENCOUNTER — Ambulatory Visit: Payer: Federal, State, Local not specified - PPO

## 2017-04-04 ENCOUNTER — Ambulatory Visit: Payer: Federal, State, Local not specified - PPO

## 2017-04-12 ENCOUNTER — Ambulatory Visit: Payer: Self-pay | Admitting: Surgery

## 2017-04-12 NOTE — H&P (Addendum)
History of Present Illness Lorraine Gave M. Taurus Alamo MD; 04/12/2017 9:37 AM) Patient words: HPI: Lorraine Gibson is a 43F with hx of HTN who presents as a referral from gynecology for evaluation of a combined procedure. She has a history of an umbilical bulge and pain present for approximately 2 years, beginning after her most recent childbirth in January 2017. The pain is getting worse over the last year particularly with straining and activity and is described as a sharp pain. This alleviated by rest. When she has the pain she also notices a bulge at the umbilicus. She denies any changes in her bowel habits. She denies history of obstructive symptoms. GYN is planning a hysterectomy for menorrhagia related to fibroids PMH: Hypertension well controlled on oral antihypertensives PSH: C-section via Pfannenstiel; transabdominal cerclage-12/2014 FHx: Mother with history of uterine cancer. Father with history of kidney cancer, ESRD, cardiomyopathy Social: Denies the use of tobacco/EtOH/drugs ROS: A compreshensive 10 system review of systems was completed with the patient - pertinent positives as noted above in HPI.  The patient is a 36 year old female.   Past Surgical History Alean Rinne, [place]; 04/12/2017 9:22 AM) Cesarean Section - 1 Foot Surgery Left.  Diagnostic Studies History Alean Rinne, [place]; 04/12/2017 9:22 AM) Colonoscopy never Mammogram never Pap Smear 1-5 years ago  Medication History Alean Rinne, RMA; 04/12/2017 9:22 AM) HydroCHLOROthiazide (25MG  Tablet, Oral) Active. Metoprolol Tartrate (50MG  Tablet, Oral) Active. Potassium (75MG  Tablet, Oral) Active. Medications Reconciled  Social History Alean Rinne, [place]; 04/12/2017 9:22 AM) No alcohol use No caffeine use No drug use Tobacco use Never smoker.  Family History Alean Rinne, [place]; 04/12/2017 9:22 AM) Cancer Father, Mother. Cerebrovascular Accident Father. Diabetes Mellitus Brother, Father,  Mother. Heart Disease Brother, Father. Hypertension Brother, Father, Mother. Kidney Disease Brother, Father. Migraine Headache Mother. Seizure disorder Father.  Pregnancy / Birth History Alean Rinne, [place]; 04/12/2017 9:22 AM) Age at menarche 29 years. Gravida 2 Length (months) of breastfeeding 3-6 Maternal age 71-30 Para 2 Regular periods  Other Problems Alean Rinne, RMA; 04/12/2017 9:22 AM) High blood pressure     Review of Systems Alean Rinne RMA; 04/12/2017 9:22 AM) General Not Present- Appetite Loss, Chills, Fatigue, Fever, Night Sweats, Weight Gain and Weight Loss. Skin Not Present- Change in Wart/Mole, Dryness, Hives, Jaundice, New Lesions, Non-Healing Wounds, Rash and Ulcer. HEENT Not Present- Earache, Hearing Loss, Hoarseness, Nose Bleed, Oral Ulcers, Ringing in the Ears, Seasonal Allergies, Sinus Pain, Sore Throat, Visual Disturbances, Wears glasses/contact lenses and Yellow Eyes. Respiratory Not Present- Bloody sputum, Chronic Cough, Difficulty Breathing, Snoring and Wheezing. Breast Not Present- Breast Mass, Breast Pain, Nipple Discharge and Skin Changes. Cardiovascular Present- Palpitations. Not Present- Chest Pain, Difficulty Breathing Lying Down, Leg Cramps, Rapid Heart Rate, Shortness of Breath and Swelling of Extremities. Gastrointestinal Not Present- Abdominal Pain, Bloating, Bloody Stool, Change in Bowel Habits, Chronic diarrhea, Constipation, Difficulty Swallowing, Excessive gas, Gets full quickly at meals, Hemorrhoids, Indigestion, Nausea, Rectal Pain and Vomiting. Female Genitourinary Not Present- Frequency, Nocturia, Painful Urination, Pelvic Pain and Urgency. Musculoskeletal Not Present- Back Pain, Joint Pain, Joint Stiffness, Muscle Pain, Muscle Weakness and Swelling of Extremities. Neurological Not Present- Decreased Memory, Fainting, Headaches, Numbness, Seizures, Tingling, Tremor, Trouble walking and Weakness. Psychiatric Not Present-  Anxiety, Bipolar, Change in Sleep Pattern, Depression, Fearful and Frequent crying. Endocrine Not Present- Cold Intolerance, Excessive Hunger, Hair Changes, Heat Intolerance, Hot flashes and New Diabetes. Hematology Not Present- Blood Thinners, Easy Bruising, Excessive bleeding, Gland problems, HIV and Persistent Infections.  Vitals Mardene Celeste Cope RMA; 04/12/2017  9:19 AM) 04/12/2017 9:19 AM Weight: 232 lb Height: 67in Body Surface Area: 2.15 m Body Mass Index: 36.34 kg/m  Temp.: 98.88F  Pulse: 68 (Regular)  BP: 140/82 (Sitting, Left Arm, Standard)      Physical Exam Lorraine Gave M. 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 Abdomen is soft, nontender; no palpable hepatomegaly; +small ~5-6CL umbilical hernia. MSK: Normal gait; no clubbing/cyanosis Psych: Appropriate affect; alert and oriented 3 Lymphatics: No palpable cervical or axillary lymphadenopathy    Assessment & Plan Lorraine Gave M. Anajah Sterbenz MD; 27/51/7001 7:49 AM)  UMBILICAL HERNIA (S49.6) Impression: 66F with small reducible umbilical hernia - planning to undergo lap vs open hysterectomy with GYN. We are being asked to assist with the umbilical hernia  -I advised at least a 20lb wt loss prior to surgery to improve her chances of success; we discussed expected postoperative goals and activity restriction of 10lb lifting restriction for 6 weeks -Will plan primary repair of hernia at time of her Gyn procedure - she has requested we not use mesh and we leave her umbilicus in situ even though the  risk of a subsequent worsening of umbilical keloid is siginficant. She is aware of the indications for mesh repair and the increased risk for recurrence without mesh. - The procedure, material risks (including, but not limited to, pain, bleeding infection, scarring/keloid, recurrence of her hernia, need for additional procedures, damange to surrounding structures including viscus, heart attack, stroke, death) benefits and alternatives were discussed at lenght. Her questions were answered to her satisfaction and she has elected to proceed.

## 2017-06-09 NOTE — Patient Instructions (Signed)
Your procedure is scheduled on:  Thursday, Jan. 3, 2019  Enter through the Main Entrance of [facility] at: 10:30 AM  Pick up the phone at the desk and dial [phone].  Call this number if you have problems the morning of surgery: [phone].  Remember: Do NOT eat food or drink after:  Midnight Wednesday  Take these medicines the morning of surgery with a SIP OF WATER:  Amlodipine, Metoprolol  Stop ALL herbal medications at this time  Do NOT smoke the day of surgery.  Do NOT wear jewelry (body piercing), metal hair clips/bobby pins, make-up, artifical eyelashes or nail polish. Do NOT wear lotions, powders, or perfumes.  You may wear deodorant. Do NOT shave for 48 hours prior to surgery. Do NOT bring valuables to the hospital. Contacts, dentures, or bridgework may not be worn into surgery.  Leave suitcase in car.  After surgery it may be brought to your room.  For patients admitted to the hospital, checkout time is 11:00 AM the day of discharge.  Bring a copy of your healthcare power of attorney and living will documents.  [place] - Preparing for Surgery Before surgery, you can play an important role.  Because skin is not sterile, your skin needs to be as free of germs as possible.  You can reduce the number of germs on your skin by washing with CHG (chlorahexidine gluconate) soap before surgery.  CHG is an antiseptic cleaner which kills germs and bonds with the skin to continue killing germs even after washing. Please DO NOT use if you have an allergy to CHG or antibacterial soaps.  If your skin becomes reddened/irritated stop using the CHG and inform your nurse when you arrive at Short Stay. Do not shave (including legs and underarms) for at least 48 hours prior to the first CHG shower.  You may shave your face/neck.  Please follow these instructions carefully:  1.  Shower with CHG Soap the night before surgery and the  morning of surgery.  2.  If you choose to wash your  hair, wash your hair first as usual with your normal  shampoo.  3.  After you shampoo, rinse your hair and body thoroughly to remove the shampoo.                             4.  Use CHG as you would any other liquid soap.  You can apply chg directly to the skin and wash.  Gently with a scrungie or clean washcloth.  5.  Apply the CHG Soap to your body ONLY FROM THE NECK DOWN.   Do   not use on face/ open                           Wound or open sores. Avoid contact with eyes, ears mouth and   genitals (private parts).                       Wash face,  Genitals (private parts) with your normal soap.             6.  Wash thoroughly, paying special attention to the area where your    surgery  will be performed.  7.  Thoroughly rinse your body with warm water from the neck down.  8.  DO NOT shower/wash with your normal soap after using and rinsing off  the CHG Soap.                9.  Pat yourself dry with a clean towel.            10.  Wear clean pajamas.            11.  Place clean sheets on your bed the night of your first shower and do not  sleep with pets. Day of Surgery : Do not apply any lotions/deodorants the morning of surgery.  Please wear clean clothes to the hospital/surgery center.  FAILURE TO FOLLOW THESE INSTRUCTIONS MAY RESULT IN THE CANCELLATION OF YOUR SURGERY  PATIENT SIGNATURE_________________________________  NURSE SIGNATURE__________________________________  ________________________________________________________________________   Lorraine Gibson  An incentive spirometer is a tool that can help keep your lungs clear and active. This tool measures how well you are filling your lungs with each breath. Taking long deep breaths may help reverse or decrease the chance of developing breathing (pulmonary) problems (especially infection) following:  A long period of time when you are unable to move or be active. BEFORE THE PROCEDURE   If the spirometer includes an  indicator to show your best effort, your nurse or respiratory therapist will set it to a desired goal.  If possible, sit up straight or lean slightly forward. Try not to slouch.  Hold the incentive spirometer in an upright position. INSTRUCTIONS FOR USE  1. Sit on the edge of your bed if possible, or sit up as far as you can in bed or on a chair. 2. Hold the incentive spirometer in an upright position. 3. Breathe out normally. 4. Place the mouthpiece in your mouth and seal your lips tightly around it. 5. Breathe in slowly and as deeply as possible, raising the piston or the ball toward the top of the column. 6. Hold your breath for 3-5 seconds or for as long as possible. Allow the piston or ball to fall to the bottom of the column. 7. Remove the mouthpiece from your mouth and breathe out normally. 8. Rest for a few seconds and repeat Steps 1 through 7 at least 10 times every 1-2 hours when you are awake. Take your time and take a few normal breaths between deep breaths. 9. The spirometer may include an indicator to show your best effort. Use the indicator as a goal to work toward during each repetition. 10. After each set of 10 deep breaths, practice coughing to be sure your lungs are clear. If you have an incision (the cut made at the time of surgery), support your incision when coughing by placing a pillow or rolled up towels firmly against it. Once you are able to get out of bed, walk around indoors and cough well. You may stop using the incentive spirometer when instructed by your caregiver.  RISKS AND COMPLICATIONS  Take your time so you do not get dizzy or light-headed.  If you are in pain, you may need to take or ask for pain medication before doing incentive spirometry. It is harder to take a deep breath if you are having pain. AFTER USE  Rest and breathe slowly and easily.  It can be helpful to keep track of a log of your progress. Your caregiver can provide you with a simple table  to help with this. If you are using the spirometer at home, follow these instructions: Lorraine Gibson IF:   You are having difficultly using the spirometer.  You have trouble using the spirometer  as often as instructed.  Your pain medication is not giving enough relief while using the spirometer.  You develop fever of 100.5 F (38.1 C) or higher. SEEK IMMEDIATE MEDICAL CARE IF:   You cough up bloody sputum that had not been present before.  You develop fever of 102 F (38.9 C) or greater.  You develop worsening pain at or near the incision site. MAKE SURE YOU:   Understand these instructions.  Will watch your condition.  Will get help right away if you are not doing well or get worse. Document Released: 10/18/2006 Document Revised: 08/30/2011 Document Reviewed: 12/19/2006 [facility] Patient Information 2014 [place], [place].   ________________________________________________________________________

## 2017-06-15 ENCOUNTER — Other Ambulatory Visit: Payer: Self-pay | Admitting: Obstetrics

## 2017-06-16 ENCOUNTER — Encounter (HOSPITAL_COMMUNITY): Payer: Self-pay

## 2017-06-16 ENCOUNTER — Other Ambulatory Visit: Payer: Self-pay

## 2017-06-16 ENCOUNTER — Encounter (HOSPITAL_COMMUNITY)
Admission: RE | Admit: 2017-06-16 | Discharge: 2017-06-16 | Disposition: A | Discharge status: Home / Self Care | Payer: Federal, State, Local not specified - PPO | Source: Ambulatory Visit | Attending: Obstetrics | Admitting: Obstetrics

## 2017-06-16 DIAGNOSIS — D259 Leiomyoma of uterus, unspecified: Secondary | ICD-10-CM | POA: Insufficient documentation

## 2017-06-16 DIAGNOSIS — Z01810 Encounter for preprocedural cardiovascular examination: Secondary | ICD-10-CM | POA: Diagnosis present

## 2017-06-16 DIAGNOSIS — Z01818 Encounter for other preprocedural examination: Secondary | ICD-10-CM | POA: Diagnosis not present

## 2017-06-16 DIAGNOSIS — K429 Umbilical hernia without obstruction or gangrene: Secondary | ICD-10-CM | POA: Insufficient documentation

## 2017-06-16 HISTORY — DX: Gastro-esophageal reflux disease without esophagitis: K21.9

## 2017-06-16 HISTORY — DX: Tachycardia, unspecified: R00.0

## 2017-06-16 LAB — COMPREHENSIVE METABOLIC PANEL
ALBUMIN: 4 g/dL (ref 3.5–5.0)
ALT: 17 U/L (ref 14–54)
ANION GAP: 10 (ref 5–15)
AST: 20 U/L (ref 15–41)
Alkaline Phosphatase: 49 U/L (ref 38–126)
BILIRUBIN TOTAL: 0.5 mg/dL (ref 0.3–1.2)
BUN: 14 mg/dL (ref 6–20)
CHLORIDE: 102 mmol/L (ref 101–111)
CO2: 25 mmol/L (ref 22–32)
Calcium: 8.9 mg/dL (ref 8.9–10.3)
Creatinine, Ser: 0.64 mg/dL (ref 0.44–1.00)
GFR calc Af Amer: >60 mL/min (ref >60)
Glucose, Bld: 113 mg/dL — ABNORMAL HIGH (ref 65–99)
POTASSIUM: 3.1 mmol/L — AB (ref 3.5–5.1)
Sodium: 137 mmol/L (ref 135–145)
TOTAL PROTEIN: 7.6 g/dL (ref 6.5–8.1)

## 2017-06-16 LAB — TYPE AND SCREEN
ABO/RH(D): A POS
Antibody Screen: NEGATIVE

## 2017-06-16 LAB — CBC WITH DIFFERENTIAL/PLATELET
BASOS ABS: 0 10*3/uL (ref 0.0–0.1)
BASOS PCT: 0 %
EOS PCT: 2 %
Eosinophils Absolute: 0.1 10*3/uL (ref 0.0–0.7)
HEMATOCRIT: 33.2 % — AB (ref 36.0–46.0)
Hemoglobin: 10.6 g/dL — ABNORMAL LOW (ref 12.0–15.0)
Lymphocytes Relative: 41 %
Lymphs Abs: 2.4 10*3/uL (ref 0.7–4.0)
MCH: 26 pg (ref 26.0–34.0)
MCHC: 31.9 g/dL (ref 30.0–36.0)
MCV: 81.6 fL (ref 78.0–100.0)
MONO ABS: 0.2 10*3/uL (ref 0.1–1.0)
MONOS PCT: 4 %
NEUTROS ABS: 3.1 10*3/uL (ref 1.7–7.7)
Neutrophils Relative %: 53 %
PLATELETS: 306 10*3/uL (ref 150–400)
RBC: 4.07 MIL/uL (ref 3.87–5.11)
RDW: 15.8 % — AB (ref 11.5–15.5)
WBC: 5.8 10*3/uL (ref 4.0–10.5)

## 2017-06-16 NOTE — Pre-Procedure Instructions (Signed)
Dr. Seward Speck reviewed EKG no new orders received at this time.

## 2017-06-16 NOTE — Pre-Procedure Instructions (Signed)
CBC with diff faxed to Dr. Pamala Hurry and Dr. Dema Severin via epic.

## 2017-06-16 NOTE — Pre-Procedure Instructions (Signed)
BMP results faxed to Dr. Pamala Hurry and Dr. Dema Severin via epic.

## 2017-06-23 ENCOUNTER — Other Ambulatory Visit: Payer: Self-pay

## 2017-06-23 ENCOUNTER — Encounter (HOSPITAL_COMMUNITY): Payer: Self-pay

## 2017-06-23 ENCOUNTER — Encounter (HOSPITAL_COMMUNITY)
Admission: AD | Disposition: A | Discharge status: Home / Self Care | Payer: Self-pay | Source: Ambulatory Visit | Attending: Obstetrics

## 2017-06-23 ENCOUNTER — Ambulatory Visit (HOSPITAL_COMMUNITY): Payer: Federal, State, Local not specified - PPO | Admitting: Certified Registered Nurse Anesthetist

## 2017-06-23 ENCOUNTER — Observation Stay (HOSPITAL_COMMUNITY)
Admission: AD | Admit: 2017-06-23 | Discharge: 2017-06-24 | Disposition: A | Discharge status: Home / Self Care | Payer: Federal, State, Local not specified - PPO | Source: Ambulatory Visit | Attending: Obstetrics | Admitting: Obstetrics

## 2017-06-23 DIAGNOSIS — Z7982 Long term (current) use of aspirin: Secondary | ICD-10-CM | POA: Diagnosis not present

## 2017-06-23 DIAGNOSIS — D259 Leiomyoma of uterus, unspecified: Secondary | ICD-10-CM | POA: Diagnosis present

## 2017-06-23 DIAGNOSIS — K429 Umbilical hernia without obstruction or gangrene: Secondary | ICD-10-CM | POA: Insufficient documentation

## 2017-06-23 DIAGNOSIS — I10 Essential (primary) hypertension: Secondary | ICD-10-CM | POA: Insufficient documentation

## 2017-06-23 DIAGNOSIS — Z79899 Other long term (current) drug therapy: Secondary | ICD-10-CM | POA: Insufficient documentation

## 2017-06-23 DIAGNOSIS — D219 Benign neoplasm of connective and other soft tissue, unspecified: Secondary | ICD-10-CM | POA: Diagnosis present

## 2017-06-23 DIAGNOSIS — L910 Hypertrophic scar: Secondary | ICD-10-CM | POA: Diagnosis not present

## 2017-06-23 HISTORY — PX: UMBILICAL HERNIA REPAIR: SHX196

## 2017-06-23 HISTORY — PX: ROBOTIC ASSISTED TOTAL HYSTERECTOMY WITH SALPINGECTOMY: SHX6679

## 2017-06-23 LAB — PREGNANCY, URINE: Preg Test, Ur: NEGATIVE

## 2017-06-23 SURGERY — REPAIR, HERNIA, UMBILICAL, ADULT
Anesthesia: General | Site: Abdomen

## 2017-06-23 MED ORDER — ROPIVACAINE HCL 5 MG/ML IJ SOLN
INTRAMUSCULAR | Status: AC
  Filled 2017-06-23: qty 30

## 2017-06-23 MED ORDER — ONDANSETRON HCL 4 MG PO TABS: 4.0000 mg | ORAL_TABLET | Freq: Four times a day (QID) | ORAL | Status: DC | PRN

## 2017-06-23 MED ORDER — FENTANYL CITRATE (PF) 250 MCG/5ML IJ SOLN
INTRAMUSCULAR | Status: AC
  Filled 2017-06-23: qty 5

## 2017-06-23 MED ORDER — KETOROLAC TROMETHAMINE 30 MG/ML IJ SOLN: 30.0000 mg | Freq: Four times a day (QID) | INTRAMUSCULAR | Status: DC

## 2017-06-23 MED ORDER — SODIUM CHLORIDE 0.9 % IJ SOLN
INTRAMUSCULAR | Status: AC
  Filled 2017-06-23: qty 10

## 2017-06-23 MED ORDER — SCOPOLAMINE 1 MG/3DAYS TD PT72
MEDICATED_PATCH | TRANSDERMAL | Status: AC
  Administered 2017-06-23: 1.5 mg via TRANSDERMAL
  Filled 2017-06-23: qty 1

## 2017-06-23 MED ORDER — LACTATED RINGERS IV SOLN: INTRAVENOUS | Status: DC

## 2017-06-23 MED ORDER — SODIUM CHLORIDE 0.9 % IV SOLN
INTRAVENOUS | Status: DC | PRN
  Administered 2017-06-23: 60 mL

## 2017-06-23 MED ORDER — HYDROMORPHONE HCL 1 MG/ML IJ SOLN
0.2500 mg | INTRAMUSCULAR | Status: DC | PRN
  Administered 2017-06-23 (×4): 0.25 mg via INTRAVENOUS

## 2017-06-23 MED ORDER — HYDROMORPHONE HCL 1 MG/ML IJ SOLN
INTRAMUSCULAR | Status: DC | PRN
  Administered 2017-06-23: 1 mg via INTRAVENOUS

## 2017-06-23 MED ORDER — EPHEDRINE SULFATE 50 MG/ML IJ SOLN
INTRAMUSCULAR | Status: DC | PRN
  Administered 2017-06-23: 10 mg via INTRAVENOUS

## 2017-06-23 MED ORDER — STERILE WATER FOR IRRIGATION IR SOLN
Status: DC | PRN
  Administered 2017-06-23: 1000 mL via INTRAVESICAL

## 2017-06-23 MED ORDER — ONDANSETRON HCL 4 MG/2ML IJ SOLN
INTRAMUSCULAR | Status: AC
  Filled 2017-06-23: qty 2

## 2017-06-23 MED ORDER — OXYCODONE-ACETAMINOPHEN 5-325 MG PO TABS
1.0000 | ORAL_TABLET | Freq: Four times a day (QID) | ORAL | Status: DC | PRN
  Administered 2017-06-24: 1 via ORAL
  Administered 2017-06-24: 2 via ORAL
  Filled 2017-06-23: qty 2
  Filled 2017-06-23 (×2): qty 1

## 2017-06-23 MED ORDER — KETOROLAC TROMETHAMINE 30 MG/ML IJ SOLN
30.0000 mg | Freq: Four times a day (QID) | INTRAMUSCULAR | Status: DC
  Administered 2017-06-23 – 2017-06-24 (×2): 30 mg via INTRAVENOUS
  Filled 2017-06-23 (×2): qty 1

## 2017-06-23 MED ORDER — SUGAMMADEX SODIUM 500 MG/5ML IV SOLN
INTRAVENOUS | Status: AC
  Filled 2017-06-23: qty 5

## 2017-06-23 MED ORDER — TRIAMCINOLONE ACETONIDE 40 MG/ML IJ SUSP
40.0000 mg | Freq: Once | INTRAMUSCULAR | Status: DC
  Filled 2017-06-23: qty 1

## 2017-06-23 MED ORDER — FENTANYL CITRATE (PF) 100 MCG/2ML IJ SOLN
INTRAMUSCULAR | Status: DC | PRN
  Administered 2017-06-23 (×2): 50 ug via INTRAVENOUS
  Administered 2017-06-23: 100 ug via INTRAVENOUS
  Administered 2017-06-23: 50 ug via INTRAVENOUS

## 2017-06-23 MED ORDER — TRIAMCINOLONE ACETONIDE 40 MG/ML IJ SUSP: 11.0000 mL | Freq: Once | INTRAMUSCULAR | Status: DC

## 2017-06-23 MED ORDER — PHENYLEPHRINE 40 MCG/ML (10ML) SYRINGE FOR IV PUSH (FOR BLOOD PRESSURE SUPPORT)
PREFILLED_SYRINGE | INTRAVENOUS | Status: AC
  Filled 2017-06-23: qty 10

## 2017-06-23 MED ORDER — KETOROLAC TROMETHAMINE 30 MG/ML IJ SOLN
INTRAMUSCULAR | Status: DC | PRN
  Administered 2017-06-23: 30 mg via INTRAVENOUS

## 2017-06-23 MED ORDER — MIDAZOLAM HCL 2 MG/2ML IJ SOLN
INTRAMUSCULAR | Status: AC
  Filled 2017-06-23: qty 2

## 2017-06-23 MED ORDER — PHENYLEPHRINE HCL 10 MG/ML IJ SOLN
INTRAMUSCULAR | Status: DC | PRN
  Administered 2017-06-23 (×2): .08 mg via INTRAVENOUS
  Administered 2017-06-23 (×2): .04 mg via INTRAVENOUS

## 2017-06-23 MED ORDER — LIDOCAINE HCL (PF) 1 % IJ SOLN
INTRAMUSCULAR | Status: AC
  Filled 2017-06-23: qty 5

## 2017-06-23 MED ORDER — PROMETHAZINE HCL 25 MG/ML IJ SOLN: 6.2500 mg | INTRAMUSCULAR | Status: DC | PRN

## 2017-06-23 MED ORDER — SCOPOLAMINE 1 MG/3DAYS TD PT72
1.0000 | MEDICATED_PATCH | Freq: Once | TRANSDERMAL | Status: DC
  Administered 2017-06-23: 1.5 mg via TRANSDERMAL

## 2017-06-23 MED ORDER — BUPIVACAINE HCL (PF) 0.25 % IJ SOLN
INTRAMUSCULAR | Status: DC | PRN
  Administered 2017-06-23: 5 mL
  Administered 2017-06-23: 20 mL

## 2017-06-23 MED ORDER — PROPOFOL 10 MG/ML IV BOLUS
INTRAVENOUS | Status: DC | PRN
  Administered 2017-06-23: 200 mg via INTRAVENOUS

## 2017-06-23 MED ORDER — HYDROMORPHONE HCL 1 MG/ML IJ SOLN
INTRAMUSCULAR | Status: AC
  Filled 2017-06-23: qty 1

## 2017-06-23 MED ORDER — SODIUM CHLORIDE 0.9 % IR SOLN
Status: DC | PRN
  Administered 2017-06-23: 3000 mL

## 2017-06-23 MED ORDER — ONDANSETRON HCL 4 MG/2ML IJ SOLN: 4.0000 mg | Freq: Four times a day (QID) | INTRAMUSCULAR | Status: DC | PRN

## 2017-06-23 MED ORDER — POTASSIUM CHLORIDE IN NACL 20-0.9 MEQ/L-% IV SOLN: INTRAVENOUS | Status: DC

## 2017-06-23 MED ORDER — CHLORHEXIDINE GLUCONATE CLOTH 2 % EX PADS: 6.0000 | MEDICATED_PAD | Freq: Once | CUTANEOUS | Status: DC

## 2017-06-23 MED ORDER — SIMETHICONE 80 MG PO CHEW
80.0000 mg | CHEWABLE_TABLET | Freq: Four times a day (QID) | ORAL | Status: DC | PRN
  Administered 2017-06-24: 80 mg via ORAL
  Filled 2017-06-23: qty 1

## 2017-06-23 MED ORDER — ARTIFICIAL TEARS OPHTHALMIC OINT
TOPICAL_OINTMENT | OPHTHALMIC | Status: AC
  Filled 2017-06-23: qty 3.5

## 2017-06-23 MED ORDER — GLYCOPYRROLATE 0.2 MG/ML IJ SOLN
INTRAMUSCULAR | Status: DC | PRN
  Administered 2017-06-23: 0.2 mg via INTRAVENOUS

## 2017-06-23 MED ORDER — GLYCOPYRROLATE 0.2 MG/ML IJ SOLN
INTRAMUSCULAR | Status: AC
  Filled 2017-06-23: qty 1

## 2017-06-23 MED ORDER — ROCURONIUM BROMIDE 100 MG/10ML IV SOLN
INTRAVENOUS | Status: AC
  Filled 2017-06-23: qty 1

## 2017-06-23 MED ORDER — CHLORHEXIDINE GLUCONATE CLOTH 2 % EX PADS
6.0000 | MEDICATED_PAD | Freq: Once | CUTANEOUS | Status: AC
  Administered 2017-06-23: 6 via TOPICAL

## 2017-06-23 MED ORDER — HYDROMORPHONE HCL 1 MG/ML IJ SOLN
INTRAMUSCULAR | Status: AC
  Filled 2017-06-23: qty 0.5

## 2017-06-23 MED ORDER — POTASSIUM CHLORIDE CRYS ER 20 MEQ PO TBCR
20.0000 meq | EXTENDED_RELEASE_TABLET | Freq: Two times a day (BID) | ORAL | Status: DC
  Filled 2017-06-23: qty 1

## 2017-06-23 MED ORDER — DEXAMETHASONE SODIUM PHOSPHATE 4 MG/ML IJ SOLN
INTRAMUSCULAR | Status: AC
  Filled 2017-06-23: qty 1

## 2017-06-23 MED ORDER — SODIUM CHLORIDE 0.9 % IV SOLN
INTRAVENOUS | Status: DC
Start: 2017-06-23 — End: 2017-06-24
  Administered 2017-06-23: 21:00:00 via INTRAVENOUS
  Filled 2017-06-23 (×3): qty 1000

## 2017-06-23 MED ORDER — CEFAZOLIN SODIUM-DEXTROSE 2-4 GM/100ML-% IV SOLN
2.0000 g | INTRAVENOUS | Status: AC
  Administered 2017-06-23 (×2): 2 g via INTRAVENOUS

## 2017-06-23 MED ORDER — EPHEDRINE 5 MG/ML INJ
INTRAVENOUS | Status: AC
  Filled 2017-06-23: qty 10

## 2017-06-23 MED ORDER — LIDOCAINE HCL (CARDIAC) 20 MG/ML IV SOLN
INTRAVENOUS | Status: DC | PRN
  Administered 2017-06-23: 50 mg via INTRAVENOUS

## 2017-06-23 MED ORDER — CEFAZOLIN SODIUM-DEXTROSE 2-4 GM/100ML-% IV SOLN: 2.0000 g | INTRAVENOUS | Status: DC

## 2017-06-23 MED ORDER — CEFAZOLIN SODIUM-DEXTROSE 2-3 GM-%(50ML) IV SOLR
INTRAVENOUS | Status: AC
  Filled 2017-06-23: qty 50

## 2017-06-23 MED ORDER — ROCURONIUM BROMIDE 100 MG/10ML IV SOLN
INTRAVENOUS | Status: DC | PRN
  Administered 2017-06-23: 50 mg via INTRAVENOUS
  Administered 2017-06-23 (×8): 10 mg via INTRAVENOUS

## 2017-06-23 MED ORDER — DEXAMETHASONE SODIUM PHOSPHATE 10 MG/ML IJ SOLN
INTRAMUSCULAR | Status: DC | PRN
  Administered 2017-06-23: 4 mg via INTRAVENOUS

## 2017-06-23 MED ORDER — PANTOPRAZOLE SODIUM 40 MG PO TBEC
40.0000 mg | DELAYED_RELEASE_TABLET | Freq: Every day | ORAL | Status: DC
  Administered 2017-06-24: 40 mg via ORAL
  Filled 2017-06-23: qty 1

## 2017-06-23 MED ORDER — BUPIVACAINE HCL (PF) 0.5 % IJ SOLN
INTRAMUSCULAR | Status: AC
  Filled 2017-06-23: qty 30

## 2017-06-23 MED ORDER — METOPROLOL TARTRATE 50 MG PO TABS
50.0000 mg | ORAL_TABLET | Freq: Every day | ORAL | Status: DC
  Administered 2017-06-24: 50 mg via ORAL
  Filled 2017-06-23: qty 1

## 2017-06-23 MED ORDER — SUGAMMADEX SODIUM 500 MG/5ML IV SOLN
INTRAVENOUS | Status: DC | PRN
  Administered 2017-06-23: 435.6 mg via INTRAVENOUS

## 2017-06-23 MED ORDER — IBUPROFEN 600 MG PO TABS: 600.0000 mg | ORAL_TABLET | Freq: Four times a day (QID) | ORAL | Status: DC | PRN

## 2017-06-23 MED ORDER — TRIAMCINOLONE ACETONIDE 40 MG/ML IJ SUSP
INTRAMUSCULAR | Status: DC | PRN
  Administered 2017-06-23: 10 mL via INTRAMUSCULAR

## 2017-06-23 MED ORDER — KETOROLAC TROMETHAMINE 30 MG/ML IJ SOLN
INTRAMUSCULAR | Status: AC
  Filled 2017-06-23: qty 1

## 2017-06-23 MED ORDER — LACTATED RINGERS IV SOLN
INTRAVENOUS | Status: DC
  Administered 2017-06-23: 14:00:00 via INTRAVENOUS
  Administered 2017-06-23: 125 mL/h via INTRAVENOUS

## 2017-06-23 MED ORDER — MENTHOL 3 MG MT LOZG: 1.0000 | LOZENGE | OROMUCOSAL | Status: DC | PRN

## 2017-06-23 MED ORDER — SODIUM CHLORIDE 0.9 % IJ SOLN
INTRAMUSCULAR | Status: AC
  Filled 2017-06-23: qty 20

## 2017-06-23 MED ORDER — MEPERIDINE HCL 25 MG/ML IJ SOLN: 6.2500 mg | INTRAMUSCULAR | Status: DC | PRN

## 2017-06-23 MED ORDER — ONDANSETRON HCL 4 MG/2ML IJ SOLN
INTRAMUSCULAR | Status: DC | PRN
  Administered 2017-06-23: 4 mg via INTRAVENOUS

## 2017-06-23 MED ORDER — HYDROMORPHONE HCL 1 MG/ML IJ SOLN
1.0000 mg | INTRAMUSCULAR | Status: DC | PRN
  Administered 2017-06-23 – 2017-06-24 (×2): 1 mg via INTRAVENOUS
  Filled 2017-06-23 (×2): qty 1

## 2017-06-23 MED ORDER — PROPOFOL 10 MG/ML IV BOLUS
INTRAVENOUS | Status: AC
  Filled 2017-06-23: qty 20

## 2017-06-23 MED ORDER — MIDAZOLAM HCL 2 MG/2ML IJ SOLN
INTRAMUSCULAR | Status: DC | PRN
  Administered 2017-06-23: 2 mg via INTRAVENOUS

## 2017-06-23 MED ORDER — BUPIVACAINE HCL (PF) 0.25 % IJ SOLN
INTRAMUSCULAR | Status: AC
  Filled 2017-06-23: qty 30

## 2017-06-23 SURGICAL SUPPLY — 68 items
APPLICATOR ARISTA FLEXITIP XL (MISCELLANEOUS) ×6 IMPLANT
BARRIER ADHS 3X4 INTERCEED (GAUZE/BANDAGES/DRESSINGS) IMPLANT
CATH FOLEY 3WAY  5CC 16FR (CATHETERS) ×2
CATH FOLEY 3WAY 5CC 16FR (CATHETERS) ×4 IMPLANT
CLOTH BEACON ORANGE TIMEOUT ST (SAFETY) ×6 IMPLANT
CONT PATH 16OZ SNAP LID 3702 (MISCELLANEOUS) ×6 IMPLANT
COVER BACK TABLE 60X90IN (DRAPES) ×12 IMPLANT
COVER TIP SHEARS 8 DVNC (MISCELLANEOUS) ×4 IMPLANT
COVER TIP SHEARS 8MM DA VINCI (MISCELLANEOUS) ×2
DECANTER SPIKE VIAL GLASS SM (MISCELLANEOUS) ×18 IMPLANT
DEFOGGER SCOPE WARMER CLEARIFY (MISCELLANEOUS) ×6 IMPLANT
DERMABOND ADVANCED (GAUZE/BANDAGES/DRESSINGS) ×4
DERMABOND ADVANCED .7 DNX12 (GAUZE/BANDAGES/DRESSINGS) ×8 IMPLANT
DRAPE LAPAROSCOPIC ABDOMINAL (DRAPES) ×6 IMPLANT
DURAPREP 26ML APPLICATOR (WOUND CARE) ×12 IMPLANT
ELECT REM PT RETURN 9FT ADLT (ELECTROSURGICAL) ×6
ELECTRODE REM PT RTRN 9FT ADLT (ELECTROSURGICAL) ×4 IMPLANT
GLOVE BIO SURGEON STRL SZ 6.5 (GLOVE) ×10 IMPLANT
GLOVE BIO SURGEON STRL SZ7.5 (GLOVE) ×6 IMPLANT
GLOVE BIO SURGEONS STRL SZ 6.5 (GLOVE) ×2
GLOVE BIOGEL PI IND STRL 7.0 (GLOVE) ×20 IMPLANT
GLOVE BIOGEL PI INDICATOR 7.0 (GLOVE) ×10
GLOVE INDICATOR 8.0 STRL GRN (GLOVE) ×6 IMPLANT
GOWN STRL REUS W/TWL XL LVL3 (GOWN DISPOSABLE) ×12 IMPLANT
HEMOSTAT ARISTA ABSORB 3G PWDR (MISCELLANEOUS) ×6 IMPLANT
KIT ACCESSORY DA VINCI DISP (KITS) ×2
KIT ACCESSORY DVNC DISP (KITS) ×4 IMPLANT
LEGGING LITHOTOMY PAIR STRL (DRAPES) ×6 IMPLANT
NEEDLE HYPO 22GX1.5 SAFETY (NEEDLE) IMPLANT
NS IRRIG 1000ML POUR BTL (IV SOLUTION) ×6 IMPLANT
OCCLUDER COLPOPNEUMO (BALLOONS) ×6 IMPLANT
PACK ABDOMINAL GYN (CUSTOM PROCEDURE TRAY) IMPLANT
PACK ROBOT WH (CUSTOM PROCEDURE TRAY) ×6 IMPLANT
PACK ROBOTIC GOWN (GOWN DISPOSABLE) ×6 IMPLANT
PACK TRENDGUARD 450 HYBRID PRO (MISCELLANEOUS) ×4 IMPLANT
PACK TRENDGUARD 600 HYBRD PROC (MISCELLANEOUS) IMPLANT
PAD PREP 24X48 CUFFED NSTRL (MISCELLANEOUS) ×6 IMPLANT
SET CYSTO W/LG BORE CLAMP LF (SET/KITS/TRAYS/PACK) ×6 IMPLANT
SET IRRIG TUBING LAPAROSCOPIC (IRRIGATION / IRRIGATOR) ×6 IMPLANT
SET TRI-LUMEN FLTR TB AIRSEAL (TUBING) ×6 IMPLANT
SPONGE LAP 18X18 X RAY DECT (DISPOSABLE) IMPLANT
SUT ETHIBOND 0 MO6 C/R (SUTURE) ×6 IMPLANT
SUT MNCRL AB 4-0 PS2 18 (SUTURE) ×6 IMPLANT
SUT VIC AB 0 CT1 27 (SUTURE) ×4
SUT VIC AB 0 CT1 27XBRD ANBCTR (SUTURE) ×8 IMPLANT
SUT VIC AB 3-0 SH 27 (SUTURE)
SUT VIC AB 3-0 SH 27X BRD (SUTURE) IMPLANT
SUT VIC AB 4-0 PS2 27 (SUTURE) ×12 IMPLANT
SUT VICRYL 0 UR6 27IN ABS (SUTURE) ×6 IMPLANT
SUT VLOC 180 0 9IN  GS21 (SUTURE) ×2
SUT VLOC 180 0 9IN GS21 (SUTURE) ×4 IMPLANT
SYR 20CC LL (SYRINGE) IMPLANT
SYSTEM CONVERTIBLE TROCAR (TROCAR) ×6 IMPLANT
TIP RUMI ORANGE 6.7MMX12CM (TIP) IMPLANT
TIP UTERINE 5.1X6CM LAV DISP (MISCELLANEOUS) IMPLANT
TIP UTERINE 6.7X10CM GRN DISP (MISCELLANEOUS) IMPLANT
TIP UTERINE 6.7X6CM WHT DISP (MISCELLANEOUS) IMPLANT
TIP UTERINE 6.7X8CM BLUE DISP (MISCELLANEOUS) ×6 IMPLANT
TOWEL OR 17X24 6PK STRL BLUE (TOWEL DISPOSABLE) ×18 IMPLANT
TOWEL OR 17X26 10 PK STRL BLUE (TOWEL DISPOSABLE) ×6 IMPLANT
TOWEL OR NON WOVEN STRL DISP B (DISPOSABLE) ×6 IMPLANT
TRENDGUARD 450 HYBRID PRO PACK (MISCELLANEOUS) ×6
TRENDGUARD 600 HYBRID PROC PK (MISCELLANEOUS)
TROCAR 12M 150ML BLUNT (TROCAR) ×6 IMPLANT
TROCAR DISP BLADELESS 8 DVNC (TROCAR) ×4 IMPLANT
TROCAR DISP BLADELESS 8MM (TROCAR) ×2
TROCAR PORT AIRSEAL 5X120 (TROCAR) ×6 IMPLANT
WATER STERILE IRR 1000ML POUR (IV SOLUTION) ×6 IMPLANT

## 2017-06-23 NOTE — Op Note (Signed)
06/23/2017  1:25 PM  PATIENT:  Lorraine Gibson  37 y.o. female  Patient Care Team: [place], [facility] as PCP - General  PRE-OPERATIVE DIAGNOSIS:  Uterine Fibroids, KELOID, UMBILICAL HERNIA  POST-OPERATIVE DIAGNOSIS:  Uterine Fibroids, KELOID, UMBILICAL HERNIA  PROCEDURE:  Open umbilical hernia repair without mesh  SURGEON:  Sharon Mt. Anastashia Westerfeld, MD  ASSISTANT: None  ANESTHESIA:   general  COUNTS:  Sponge, needle and instrument counts were reported correct x2 at the conclusion of the operation.  EBL: 10cc  DRAINS: None  SPECIMEN: Hernia sac  FINDINGS: Prior surgery through her umbilicus. 2 small hernias - one was a true umbilical hernia at the site presumably where she had her prior procedure - the hernia sac was scarred to the dermis of the umbilical skin. 39mm defect in umbilical skin during separation of sac from dermis. Umbilical hernia was ~7.6P9.5KD. 2cm cephalad she had a preperitoneal fat containing 76mm ventral hernia. Given her preferences primary closure was performed.  DISPOSITION: Case was turned over to Dr. Pamala Hurry.  INDICATION: [name] is a pleasant 37yo lady with hx of HTN who presented to our office as a referral from gynecology for evaluation of a combined procedure. She has a history of an umbilical bulge and pain present for approximately 2 years, beginning after her most recent childbirth in January 2017. The pain has been getting worse over the last year particularly with straining and activity and is described as a sharp pain. This alleviated by rest. When she has the pain she also notices a bulge at the umbilicus. She denies any changes in her bowel habits. She denies history of obstructive symptoms. On exam she had a small palpable umbilical hernia at the site of her prior procedure. Gynecology is planning a hysterectomy for menorrhagia related to fibroids today.  The anatomy and physiology of the abdominal was described in detail using  pictures/diagrams. The pathophysiology was also detailed. The planned procedure, material risks (including, but not limited to, pain, bleeding, infection, scarring & keloid, hernia recurrence, damage to surrounding structures, need for additional procedures, hematoma, seroma, skin necrosis), benefits and alternatives were explained. The patient's questions were answered to her satisfaction and she elected to proceed with surgery.  DESCRIPTION: The patient was identified in preop holding and taken to the OR where she was placed on the operating room table and SCDs were placed.  She was then positioned in lithotomy with Allen stirrups. General endotracheal anesthesia was induced without difficulty. The patient was then prepped and draped in the usual sterile fashion. A surgical timeout was performed indicating the correct patient, procedure, positioning and need for preoperative antibiotics.   A supraumbilical incision was made in a curvilinear manner.  This was dissected down to the subcutaneous tissue until the hernia sac was identified.  The hernia sac was a true umbilical hernia originating within the umbilical stalk.  The overlying umbilical skin and dermis was then gently separated sharply and a 5 mm "buttonhole" was made in the umbilical skin.  The stalk was circumferentially dissected and sharply opened at its apex just beneath the umbilical skin.  The sac was empty prior to doing this.  A finger was gently inserted to the perineal cavity through the opening in the sac and sequential palpation revealed no evidence of underlying scar tissue or bowel.  The umbilical stalk/hernia sac was then excised back to healthy fascial edges.  Coker clamps were placed on the fascial edges and the subcutaneous tissue overlying it was dissected free  for approximately 2 cm circumferentially.  Just above this approximately 1 cm there was a 5 mm defect in the fascia containing preperitoneal fat.  The preperitoneal fat was  excised and this fascial opening was also circumferentially dissected.  Given the patient's preferences for a primary repair this supraumbilical 5 mm hernia was repaired with two 0 Ethibond sutures.  A robotic trocar was then inserted through the umbilical opening into the peritoneal cavity and insufflation commenced and the laparoscope was inserted and the perineal cavity and the underlying structures-bowel and omentum were inspected.  There is no evidence of injury to the structures..  Dr. Pamala Hurry then scrubbed and placed one additional trocar in the lateral abdominal wall.  The laparoscope was then inserted through this lateral trocar and the umbilical site inspected and confirmed to have no additional adhesions to the abdominal wall.  The pneumoperitoneum was released.  0 Ethibond sutures were then placed to close the fascial defect to approximately 1cm.  A figure-of-eight 0 Ethibond suture was then placed and left untied with a hemostat clamp on it.  The 5 mm umbilical skin "buttonhole" was then closed internally with a interrupted 4-0 Monocryl suture. The laparoscopic umbilical trocar was then reinserted.  Instructions were then provided to the GYN team for management of this 0 Ethibond figure-of-eight suture which essentially includes tying at the conclusion of the case to primarily close the umbilical defect by the GYN team.  3-0 Vicryl suture will then be used to tack the umbilical skin to the tied with figure-of-eight Ethibond suture to recreate the umbilicus.  The plan for skin closure is a 4-0 running Monocryl suture. The case was turned back over to Dr. Pamala Hurry.  Note: This dictation was prepared with Dragon/digital dictation along with [employer]. Any transcriptional errors that result from this process are unintentional.

## 2017-06-23 NOTE — Transfer of Care (Signed)
Immediate Anesthesia Transfer of Care Note  Patient: Lorraine Gibson  Procedure(s) Performed: HERNIA REPAIR UMBILICAL ADULT (N/A Abdomen) ROBOTIC ASSISTED TOTAL HYSTERECTOMY WITH SALPINGECTOMY (Bilateral Abdomen)  Patient Location: PACU  Anesthesia Type:General  Level of Consciousness: awake, alert  and oriented  Airway & Oxygen Therapy: Patient Spontanous Breathing and Patient connected to nasal cannula oxygen  Post-op Assessment: Report given to RN and Post -op Vital signs reviewed and stable  Post vital signs: Reviewed and stable  Last Vitals:  Vitals:   06/23/17 1033  BP: 129/85  Pulse: 64  Resp: 16  Temp: 36.9 C  SpO2: 100%    Last Pain:  Vitals:   06/23/17 1033  TempSrc: Oral      Patients Stated Pain Goal: 6 (74/71/85 5015)  Complications: No apparent anesthesia complications

## 2017-06-23 NOTE — Brief Op Note (Signed)
06/23/2017  6:29 PM  PATIENT:  Lorraine Gibson  37 y.o. female  PRE-OPERATIVE DIAGNOSIS:  Uterine Fibroids, KELOID, UMBILICAL HERNIA  POST-OPERATIVE DIAGNOSIS:  Uterine Fibroids, KELOID, UMBILICAL HERNIA  PROCEDURE:  Procedure(s): HERNIA REPAIR UMBILICAL ADULT (N/A) ROBOTIC ASSISTED TOTAL HYSTERECTOMY WITH SALPINGECTOMY (Bilateral)  Removal of keloid  SURGEON:  Surgeon(s) and Role: Panel 1:    * White, Sharon Mt, MD - Primary Panel 2:    * Aloha Gell, MD - Primary    * Almquist, Earlyne Iba, MD - Assisting  PHYSICIAN ASSISTANT:   ASSISTANTS: as above   ANESTHESIA:   local and general  EBL:  150 mL   BLOOD ADMINISTERED:none  DRAINS: Urinary Catheter (Foley)   LOCAL MEDICATIONS USED:  MARCAINE    and BUPIVICAINE   SPECIMEN:  Source of Specimen:  uterus cervix bilateral fallopian tubes  DISPOSITION OF SPECIMEN:  PATHOLOGY  COUNTS:  YES  TOURNIQUET:  * No tourniquets in log *  DICTATION: .Note written in EPIC  PLAN OF CARE: Admit for overnight observation  PATIENT DISPOSITION:  PACU - hemodynamically stable.   Delay start of Pharmacological VTE agent (>24hrs) due to surgical blood loss or risk of bleeding: yes

## 2017-06-23 NOTE — Anesthesia Procedure Notes (Signed)
Procedure Name: Intubation Date/Time: 06/23/2017 12:03 PM Performed by: Bufford Spikes, CRNA Pre-anesthesia Checklist: Patient identified, Emergency Drugs available, Suction available and Patient being monitored Patient Re-evaluated:Patient Re-evaluated prior to induction Oxygen Delivery Method: Circle system utilized Preoxygenation: Pre-oxygenation with 100% oxygen Induction Type: IV induction Ventilation: Mask ventilation without difficulty Laryngoscope Size: Miller and 2 Grade View: Grade II Tube type: Oral Tube size: 7.0 mm Number of attempts: 1 Airway Equipment and Method: Stylet Placement Confirmation: ETT inserted through vocal cords under direct vision,  positive ETCO2 and breath sounds checked- equal and bilateral Secured at: 22 cm Tube secured with: Tape Dental Injury: Teeth and Oropharynx as per pre-operative assessment

## 2017-06-23 NOTE — Anesthesia Preprocedure Evaluation (Addendum)
Anesthesia Evaluation  Patient identified by MRN, date of birth, ID band Patient awake    Reviewed: Allergy & Precautions, NPO status , Patient's Chart, lab work & pertinent test results  Airway Mallampati: II  TM Distance: >3 FB Neck ROM: Full    Dental  (+) Teeth Intact, Dental Advisory Given   Pulmonary neg pulmonary ROS,    breath sounds clear to auscultation       Cardiovascular hypertension, Pt. on medications and Pt. on home beta blockers  Rhythm:Regular Rate:Normal     Neuro/Psych  Headaches, negative psych ROS   GI/Hepatic Neg liver ROS, GERD  ,  Endo/Other  diabetes  Renal/GU negative Renal ROS     Musculoskeletal negative musculoskeletal ROS (+)   Abdominal Normal abdominal exam  (+)   Peds  Hematology   Anesthesia Other Findings   Reproductive/Obstetrics negative OB ROS                            Lab Results  Component Value Date   WBC 5.8 06/16/2017   HGB 10.6 (L) 06/16/2017   HCT 33.2 (L) 06/16/2017   MCV 81.6 06/16/2017   PLT 306 06/16/2017   Lab Results  Component Value Date   CREATININE 0.64 06/16/2017   BUN 14 06/16/2017   NA 137 06/16/2017   K 3.1 (L) 06/16/2017   CL 102 06/16/2017   CO2 25 06/16/2017   Lab Results  Component Value Date   INR 1.03 06/29/2015   EKG: normal sinus rhythm.  Anesthesia Physical Anesthesia Plan  ASA: II  Anesthesia Plan: General   Post-op Pain Management:    Induction: Intravenous  PONV Risk Score and Plan: 4 or greater and Ondansetron, Midazolam, Scopolamine patch - Pre-op and Dexamethasone  Airway Management Planned: Oral ETT  Additional Equipment: None  Intra-op Plan:   Post-operative Plan: Extubation in OR  Informed Consent: I have reviewed the patients History and Physical, chart, labs and discussed the procedure including the risks, benefits and alternatives for the proposed anesthesia with the patient or  authorized representative who has indicated his/her understanding and acceptance.   Dental advisory given  Plan Discussed with:   Anesthesia Plan Comments:         Anesthesia Quick Evaluation

## 2017-06-23 NOTE — H&P (Signed)
CC: fibroids, menorrhagia  HPI: attempted TLH for above, also general surgery for hernia repair.   Past Medical History:  Diagnosis Date  . Anemia   . GERD (gastroesophageal reflux disease)    takes TUMS as needed  . Gestational diabetes   . Gestational diabetes mellitus (GDM), antepartum   . Headache    with pre eclampsia  . Hx of cerclage, currently pregnant   . Hypertension   . Tachycardia   . Vaginal Pap smear, abnormal     Past Surgical History:  Procedure Laterality Date  . ABDOMINAL CERCLAGE    . ANKLE SURGERY    . CERVICAL CERCLAGE    . CESAREAN SECTION    . CESAREAN SECTION N/A 06/29/2015   Procedure: CESAREAN SECTION;  Surgeon: Princess Bruins, MD;  Location: [facility];  Service: Obstetrics;  Laterality: N/A;  Most likely surgery after %PM - will be Dr Ronita Hipps instead of Lavoie ATE full meal 1400  . FOOT SURGERY    . LEEP      PE: Gen: well appearing, no distress Abd: obese, NT Skin: abdominal keloid GU: def to OR  CBC    Component Value Date/Time   WBC 5.8 06/16/2017 1200   RBC 4.07 06/16/2017 1200   HGB 10.6 (L) 06/16/2017 1200   HCT 33.2 (L) 06/16/2017 1200   PLT 306 06/16/2017 1200   MCV 81.6 06/16/2017 1200   MCH 26.0 06/16/2017 1200   MCHC 31.9 06/16/2017 1200   RDW 15.8 (H) 06/16/2017 1200   LYMPHSABS 2.4 06/16/2017 1200   MONOABS 0.2 06/16/2017 1200   EOSABS 0.1 06/16/2017 1200   BASOSABS 0.0 06/16/2017 1200    K- 3.1- 3.2  A/P: TLH as planned, pt aware r/b, may need to open. Follow K post-op  .Ala Dach 06/23/2017 11:43 AM

## 2017-06-23 NOTE — Op Note (Addendum)
06/23/2017  6:29 PM  PATIENT:  Lorraine Gibson  37 y.o. female  PRE-OPERATIVE DIAGNOSIS:  Uterine Fibroids, KELOID, UMBILICAL HERNIA  POST-OPERATIVE DIAGNOSIS:  Uterine Fibroids, KELOID, UMBILICAL HERNIA  PROCEDURE:  Procedure(s): HERNIA REPAIR UMBILICAL ADULT (N/A) ROBOTIC ASSISTED TOTAL HYSTERECTOMY WITH SALPINGECTOMY (Bilateral)  Removal of keloid  SURGEON:  Surgeon(s) and Role: Panel 1:    * White, Sharon Mt, MD - Primary Panel 2:    * Aloha Gell, MD - Primary    * Almquist, Earlyne Iba, MD - Assisting  PHYSICIAN ASSISTANT:   ASSISTANTS: as above   ANESTHESIA:   local and general  EBL:  150 mL   BLOOD ADMINISTERED:none  DRAINS: Urinary Catheter (Foley)   LOCAL MEDICATIONS USED:  MARCAINE    and BUPIVICAINE   SPECIMEN:  Source of Specimen:  uterus cervix bilateral fallopian tubes  DISPOSITION OF SPECIMEN:  PATHOLOGY  COUNTS:  YES  TOURNIQUET:  * No tourniquets in log *  DICTATION: .Note written in EPIC  PLAN OF CARE: Admit for overnight observation  PATIENT DISPOSITION:  PACU - hemodynamically stable.   Delay start of Pharmacological VTE agent (>24hrs) due to surgical blood loss or risk of bleeding: yes   Procedure:Robotic-assisted total laparoscopic hysterectomy, resection of keloid and umbilical hernia repair Complications: none Antibiotics: 2 g Ancef, with second dose 5 hours later Findings: nl liver edge, normal gallbladder, no evidence endometriosis, nl b/l ovaries and tubes, enlarged uterus with multiple fibroids. Hemostasis post-procedure. 2 cm thick keloid scar, direct umbilical hernia.   Indications: This is a 37 year old G2 P2 with known fibroid uterus and menorrhagia here for definitive hysterectomy with risk reducing salpingectomy. Patient also with abdominal cervical cerclage for removal and hernia repair. Pt has failed conservative therapy and wants definitive surgical management.  Procedure: After informed consent and discussion of  alternatives to hysterectomy, the patient was taken to the operating room where general anesthesia was initiated without difficulty. She was prepped and draped in normal sterile fashion in the dorsal supine lithotomy position. A Foley catheter was inserted sterilely into the bladder.  Gen. Surgery began the case with umbilical hernia repair. Those notes to be found in a separate dictation. Once the hernia sac at the umbilicus was removed intraperitoneal entry was accomplished and a 10 mm non-bladed Sheryle Hail was placed into the abdominal cavity. Due to the preference of the surgeon additional laparoscopic sites were made. A total of 25 cc 1/4% Marcaine wears used. Abdominal insufflation was done with CO2 gas and laparoscopic port sites were marked. Local anesthesia was placed and 8 mm skin incisions were made with the scalpel. The right lower quadrant laparoscopic port was placed under direct visualization. Using an 8 mm camera and this port the umbilicus all hernia site was evaluated. No evidence of bowel injury and good closure of the hernia sac was noted. Camera was removed. The general surgeonfinished the fascial repair leaving the umbilical port site and place.  I then took over as the Pharmacologist. Timeout was repeated for the second portion of the case.  A weighted speculum was placed in the vagina and deaver retractors were used on the anterior vaginal wall. .The cervix was grasped with tenaculum And a stay suture was placed on the anterior cervical lip. The cervix was sounded to 9 cm. The cervix was assessed to identify the Rumi-Co size.  A medium cup and an 9 cm shaft was used. The uterine balloon was inflated.  Gloved were changed. Attention was then turned to the patient's  abdomen. 0.5 % marcaine was used prior to all incision.the abdomen was reinsufflated. 8 mm incisions were  Made prior and placed in the right (two) and left lower quadrants (2) and non-bladed ports were placed under direct  visualization. The robot was brought to the patient's side and attached with the right side docking. The robotic instruments were placed under direct visualization until proper placement just over the uterus.  I then went to the robotic console. Brief survey of the patient's abdomen and pelvis revealed  findings as above. Enlarged uterus with multiple fibroids were encountered. Redundancy of peritoneal tissue noted. The broad ligament was markedly thickened with redundancy and increased vascularity due to the multiple fibroids.  Bladder adhesions also noted. Bilateral tubes and ovaries were normal. Ureters were identified and seen peristalsing deep in the pelvis.  I began on the left side: the  left tube was serially cauterized with bipolar cautery with the use of the PK device and transected with the monopolar scissors. The  left utero-ovarian ligament was serially cauterized with the bipolar PK and transected with the monopolar scissors. The left round ligament was cauterized and divided.  The anterior leaf of the broad ligament was dissected bluntly then monopolar cautery was used to separate the anterior and posterior leafs of the broad ligament. Due to excessive vascularity this step took longer than usual. Careful attention to hemostasis was done. This was carried down through to the bladder flap. Significant adhesions were noted and about 20 extremity minutes were spent in dissecting the adhesions around the bladder. The bladder was backfilled with 200 cc of fluid in order to better identify the edge of the bladder flap.   Attention was then turned to the patient's  Right side: the right tube was cauterized and transected,  the  right uterine ovarian ligament was serially cauterized with the PK and transected with the monopolar scissors. The right  broad ligament was cauterized and transected. The anterior and posterior leaves of the broad ligament were bluntly and sharply dissected apart from each other.  Again this broad ligament was thick with excessive vascularity due to fibroids. Monopolar cautery was used to come across the anterior leaf of the right broad ligament. Posterior dissection was also done with the monopolar scissors. Additional anterior dissection along thebladder flap was done.   The leftt uterine artery was serially cauterized with the PK and transected with the monopolar scissors. The right uterine artery and then serially cauterized with the PK and transected with the monopolar scissors. The uterus was placed on stretch to allow better visualization of the arteries. Due to the size of the uterus manipulation of the uterus remains limited through most of the case. The bladder flap was further taken down both sharply and with cautery. Cautery was used for hemostasis on several places along the bladder flap. At this point with the pressure inward on the Rumi, the anteriorcolpotomy was made with the monopolar scissors. This was carried around to the patient's right side. Additional bipolar cautery was used on the  both angles of the cuff- this was carried out with the PK bipolar. Attempt was then made to position the uterus anteriorly in order to evaluate the posterior colpotomy. However due to the bulkiness of the uterus was unable to be manipulated into an anterior position. Careful attempt from the right and the left side was used in order to access the posterior colpotomy. This was entered with monopolar scissors in the uterus and cervix were eventually transected from  the vaginal tissue.  Due to the size of the fibroid I then went to the patient's perineum for removal of thepecimen. The patient had a very narrow perineal area which was made more difficult by her body habitus. Using various retractors and multiple assistants attempt was made to remove the specimen. The poor tissue of the cervix lead to continued tearing of sutures and tenaculum was placed on the cervix. 75 minutes were spent  in morcellating the specimen from the vagina. Several myomectomies and coring of the myometrial tissue was done with scissors and tenaculum and retraction. Eventually the large fibroids were removed with the uterus. Bilateral tubes and cervix were confirmed with the specimen.   Irrigation was used and the vaginal cuff appeared hemostatic. A 9 inch V-lock suture was placed though the umbilical port. Instruments were changed to allow a suture cut needle driver through the #1 port and and a long-tipped forceps through the #2 port. The right angle was closed and locked with the V-lock suture. Running suture was carried out tothe L angle.  A more superficial layer of suture was placed travelling back to the right angle. The suture was cut and  Removed.   Excellent hemostasis was noted. Suction irrigation was carried out and hemostasis was assured along the vaginal cuff. The utero-ovarian ligaments were reassessed also found to be hemostatic. All free fluid was removed from the abdomen. The robotic portion was completed. The robot was undocked.   By standard laparoscopy the pelvis was irrigated and evaluated.  The patient was placed in reverse Trendelenburg, all additional fluid was suctioned from the abdomen and pelvis the cuff was reinspected and  found to be hemostatic. All pedicles were also found to be hemostatic. A small amount of bleeding was noted from the bladder adhesions and this was controlled with Arista. 60 CC bupivacaine was placed into the peritoneal cavity.  Under direct visualization the ports were removed. Pneumoperitoneum was released.  The umbilical hernia site was closed. The surgeon had prior placed the pursestring suture of Ethibond and this was closed tightly. No residual defect was noted. Using a 3-0 Vicryl the fascia was tethered to the base of the umbilicus. 2 figure-of-eight sutures were placed. The dead space from the umbilical hernia incision was closed with a running 3-0 Vicryl  suture in the deep space. The skin was closed with 4-0 Vicryl in a running subcuticular fashion.   No additional fascial incisions were closed due to the small size and the nondilated nature of these incisions. 1 cc of Kenalog mixed in half percent Marcaine, 10 cc, was used as daily prevention to all skin sites. A scalpel and pickup was used to sharply excise the 2 cm left lower quadrant keloid. The skin incision was continuous with the robotic port site.  A 4-0 Vicryl was used to close the additional laparoscopic ports sites. Good hemostasis was noted.  The vagina was reevaluated no active bleeding was noted. Blood-tinged urine was noted after the aggressive vaginal manipulation and removal of specimen and this was beginning to clear at this time.   Sponge lap and needle counts were correct x3 the patient was woken from general anesthesia having tolerated the procedure well and taken to the recovery room in a stable fashion.  Ala Dach 06/23/2017 6:49 PM    Specimen weight 574g

## 2017-06-23 NOTE — H&P (Signed)
CC: Umbilical hernia - undergoing robotic TAH/BSO with gyn today and I have been requested to assist  HPI: [name] is a pleasant 37yo lady with hx of HTN who presented to our office as a referral from gynecology for evaluation of a combined procedure. She has a history of an umbilical bulge and pain present for approximately 2 years, beginning after her most recent childbirth in January 2017. The pain has been getting worse over the last year particularly with straining and activity and is described as a sharp pain. This alleviated by rest. When she has the pain she also notices a bulge at the umbilicus. She denies any changes in her bowel habits. She denies history of obstructive symptoms. GYN is planning a hysterectomy for menorrhagia related to fibroids today  PMH: Hypertension well controlled on oral antihypertensives  PSH: C-section via Pfannenstiel; transabdominal cerclage-12/2014  Past Medical History:  Diagnosis Date  . Anemia   . GERD (gastroesophageal reflux disease)    takes TUMS as needed  . Gestational diabetes   . Gestational diabetes mellitus (GDM), antepartum   . Headache    with pre eclampsia  . Hx of cerclage, currently pregnant   . Hypertension   . Tachycardia   . Vaginal Pap smear, abnormal     Past Surgical History:  Procedure Laterality Date  . ABDOMINAL CERCLAGE    . ANKLE SURGERY    . CERVICAL CERCLAGE    . CESAREAN SECTION    . CESAREAN SECTION N/A 06/29/2015   Procedure: CESAREAN SECTION;  Surgeon: Princess Bruins, MD;  Location: [facility];  Service: Obstetrics;  Laterality: N/A;  Most likely surgery after %PM - will be Dr Ronita Hipps instead of Lavoie ATE full meal 1400  . FOOT SURGERY    . LEEP      Family History  Problem Relation Age of Onset  . Hypertension Mother   . Cancer Mother   . Hypertension Father   . Kidney disease Father   . Heart disease Father   . Cancer Father   . Hypertension Brother   . Diabetes Brother   . Kidney  disease Brother     Social:  reports that  has never smoked. she has never used smokeless tobacco. She reports that she does not drink alcohol or use drugs.  Allergies: No Known Allergies  Medications: I have reviewed the patient's current medications.  Results for orders placed or performed during the hospital encounter of 06/23/17 (from the past 48 hour(s))  Pregnancy, urine     Status: None   Collection Time: 06/23/17 10:30 AM  Result Value Ref Range   Preg Test, Ur NEGATIVE NEGATIVE    Comment:        THE SENSITIVITY OF THIS METHODOLOGY IS >20 mIU/mL.     No results found.  ROS - all of the below systems have been reviewed with the patient and positives are indicated with bold text General: chills, fever or night sweats Eyes: blurry vision or double vision ENT: epistaxis or sore throat Allergy/Immunology: itchy/watery eyes or nasal congestion Hematologic/Lymphatic: bleeding problems, blood clots or swollen lymph nodes Endocrine: temperature intolerance or unexpected weight changes Breast: new or changing breast lumps or nipple discharge Resp: cough, shortness of breath, or wheezing CV: chest pain or dyspnea on exertion GI: as per HPI GU: dysuria, trouble voiding, or hematuria MSK: joint pain or joint stiffness Neuro: TIA or stroke symptoms Derm: pruritus and skin lesion changes Psych: anxiety and depression  PE Blood pressure 129/85,  pulse 64, temperature 98.5 F (36.9 C), temperature source Oral, resp. rate 16, last menstrual period 05/31/2017, SpO2 100 %, not currently breastfeeding. 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 Abdomen is soft, nontender; no palpable hepatomegaly; +small  ~6-7TI umbilical hernia. MSK: Normal gait; no clubbing/cyanosis Psychiatric: Appropriate affect; alert and oriented x3 Lymphatic: No palpable cervical or axillary lymphadenopathy  Results for orders placed or performed during the hospital encounter of 06/23/17 (from the past 48 hour(s))  Pregnancy, urine     Status: None   Collection Time: 06/23/17 10:30 AM  Result Value Ref Range   Preg Test, Ur NEGATIVE NEGATIVE    Comment:        THE SENSITIVITY OF THIS METHODOLOGY IS >20 mIU/mL.     A/P: Lorraine Gibson is an 37 y.o. female with small reducible umbilical hernia undergoing robotic TAH/BSO today - we have been asked to assist with entry a setup for hernia repair  -OR today - my portion of the procedure will be primary repair of the umbilical hernia - I will leave loosely placed sutures for planned entry for Dr. Earl Many robotic portion/trocar placement. At the conclusion, these will be tied by the gyn service. All of this was also discussed with the patient and her family. -Will plan primary repair of hernia at time of her Gyn procedure - she has requested we not use mesh and we leave her umbilicus in situ even though the risk of a subsequent worsening of umbilical keloid is siginficant.  - The planned procedure, material risks (including, but not limited to, pain, bleeding infection, scarring/keloid, recurrence of her hernia, need for additional procedures, damange to surrounding structures including viscus, heart attack, stroke, death) benefits and alternatives were discussed at length. Her questions were answered to her satisfaction and she has elected to proceed.  Sharon Mt. Dema Severin, M.D. General and Colorectal Surgery [facility]

## 2017-06-24 ENCOUNTER — Encounter (HOSPITAL_COMMUNITY): Payer: Self-pay | Admitting: Surgery

## 2017-06-24 ENCOUNTER — Other Ambulatory Visit: Payer: Self-pay

## 2017-06-24 DIAGNOSIS — D259 Leiomyoma of uterus, unspecified: Secondary | ICD-10-CM | POA: Diagnosis not present

## 2017-06-24 LAB — COMPREHENSIVE METABOLIC PANEL
ALT: 16 U/L (ref 14–54)
ANION GAP: 11 (ref 5–15)
AST: 38 U/L (ref 15–41)
Albumin: 3.2 g/dL — ABNORMAL LOW (ref 3.5–5.0)
Alkaline Phosphatase: 38 U/L (ref 38–126)
BILIRUBIN TOTAL: 0.7 mg/dL (ref 0.3–1.2)
BUN: 11 mg/dL (ref 6–20)
CHLORIDE: 105 mmol/L (ref 101–111)
CO2: 21 mmol/L — ABNORMAL LOW (ref 22–32)
Calcium: 7.8 mg/dL — ABNORMAL LOW (ref 8.9–10.3)
Creatinine, Ser: 0.69 mg/dL (ref 0.44–1.00)
GFR calc Af Amer: >60 mL/min (ref >60)
GFR calc non Af Amer: >60 mL/min (ref >60)
Glucose, Bld: 159 mg/dL — ABNORMAL HIGH (ref 65–99)
POTASSIUM: 3.5 mmol/L (ref 3.5–5.1)
Sodium: 137 mmol/L (ref 135–145)
TOTAL PROTEIN: 6.7 g/dL (ref 6.5–8.1)

## 2017-06-24 LAB — CBC
HEMATOCRIT: 31.5 % — AB (ref 36.0–46.0)
Hemoglobin: 10.2 g/dL — ABNORMAL LOW (ref 12.0–15.0)
MCH: 26 pg (ref 26.0–34.0)
MCHC: 32.4 g/dL (ref 30.0–36.0)
MCV: 80.2 fL (ref 78.0–100.0)
PLATELETS: 289 10*3/uL (ref 150–400)
RBC: 3.93 MIL/uL (ref 3.87–5.11)
RDW: 16.1 % — AB (ref 11.5–15.5)
WBC: 12.8 10*3/uL — AB (ref 4.0–10.5)

## 2017-06-24 MED ORDER — IBUPROFEN 600 MG PO TABS: 600.0000 mg | ORAL_TABLET | Freq: Four times a day (QID) | ORAL | 1 refills | Status: DC | PRN

## 2017-06-24 MED ORDER — OXYCODONE-ACETAMINOPHEN 5-325 MG PO TABS: 1.0000 | ORAL_TABLET | Freq: Four times a day (QID) | ORAL | 0 refills | Status: DC | PRN

## 2017-06-24 NOTE — Plan of Care (Signed)
  Progressing Nutrition: Adequate nutrition will be maintained 06/24/2017 0237 - Progressing by Ledell Noss, RN Note Tolerating fluids witll increase diet to regular Safety: Ability to remain free from injury will improve 06/24/2017 0237 - Progressing by Ledell Noss, RN Note Pt given assistance when getting oob and ambulating in hall

## 2017-06-24 NOTE — Anesthesia Postprocedure Evaluation (Signed)
Anesthesia Post Note  Patient: Lorraine Gibson  Procedure(s) Performed: HERNIA REPAIR UMBILICAL ADULT (N/A Abdomen) ROBOTIC ASSISTED TOTAL HYSTERECTOMY WITH SALPINGECTOMY (Bilateral Abdomen)     Patient location during evaluation: [facility] Anesthesia Type: General Level of consciousness: awake Pain management: pain level controlled Vital Signs Assessment: post-procedure vital signs reviewed and stable Respiratory status: spontaneous breathing Cardiovascular status: stable Postop Assessment: no apparent nausea or vomiting and adequate PO intake Anesthetic complications: no    Last Vitals:  Vitals:   06/24/17 0357 06/24/17 0819  BP: 120/76 124/77  Pulse: 86 78  Resp: 18 18  Temp: 37.1 C 36.9 C  SpO2: 97% 100%    Last Pain:  Vitals:   06/24/17 0819  TempSrc: Oral  PainSc:    Pain Goal: Patients Stated Pain Goal: 6 (06/24/17 0551)               Everette Rank

## 2017-06-24 NOTE — Progress Notes (Signed)
Subjective No acute events. Doing well. Feeling well. Denies complaints. Pain controlled.  Objective: Vital signs in last 24 hours: Temp:  [97.9 F (36.6 C)-98.8 F (37.1 C)] 98.4 F (36.9 C) (01/04 0819) Pulse Rate:  [64-101] 78 (01/04 0819) Resp:  [16-24] 18 (01/04 0819) BP: (111-142)/(68-97) 124/77 (01/04 0819) SpO2:  [97 %-100 %] 100 % (01/04 0819) Weight:  [110.2 kg (243 lb 0.5 oz)] 110.2 kg (243 lb 0.5 oz) (01/04 0357)    Intake/Output from previous day: 01/03 0701 - 01/04 0700 In: 3905 [P.O.:880; I.V.:3025] Out: 2800 [Urine:2500; Stool:150; Blood:150] Intake/Output this shift: No intake/output data recorded.  Gen: NAD, comfortable CV: RRR Pulm: Normal work of breathing Abd: Soft, NT/ND; incisions c/d/i; umbilicus dressed with honeycomb dressing Ext: SCDs in place  Lab Results: CBC  Recent Labs    06/24/17 0545  WBC 12.8*  HGB 10.2*  HCT 31.5*  PLT 289   BMET Recent Labs    06/24/17 0545  NA 137  K 3.5  CL 105  CO2 21*  GLUCOSE 159*  BUN 11  CREATININE 0.69  CALCIUM 7.8*   Assessment/Plan: Patient Active Problem List   Diagnosis Date Noted  . Fibroids 06/23/2017  . Postpartum care following cesarean delivery (1/8) 06/30/2015  . Third trimester bleeding 06/29/2015   s/p Procedure(s): HERNIA REPAIR UMBILICAL ADULT ROBOTIC ASSISTED TOTAL HYSTERECTOMY WITH SALPINGECTOMY 06/23/2017  -Recovering well -Only postop restriction we have is heavy lifting; nothing more than a gallon of milk in weight (~10lbs) for 6 weeks -Will plan to see back in clinic for postop in 2wks - leaving this in AVS -We are available should questions/concerns arise   LOS: 0 days   Sharon Mt. Dema Severin, M.D. General and Colorectal Surgery [facility]

## 2017-06-24 NOTE — Progress Notes (Signed)
Pt discharged with printed instructions. Pt verbalized an understanding. No concerns noted. Marolyn Urschel L Jacksyn Beeks, RN 

## 2017-06-29 NOTE — Discharge Summary (Signed)
Lorraine Gibson MRN: [id number] DOB/AGE: Jan 23, 1981 37 y.o.  Admit date: 06/23/2017 Discharge date: 06/24/08  Admission Diagnoses: Uterine Fibroids  Discharge Diagnoses: Uterine Fibroids        Active Problems:   Fibroids   Discharged Condition: stable  Hospital Course: Pt admitted for robotic assisted TLH with b/l salpingectomy. Op note can be found in a separate dictation. Given the adhesions and large fibroid uterus, surgical time was 6 hrs. Direct umbilical hernia repair was done by general surgery at the start of the case. Significant time was spent removing the large fibroid uterus from the vagina. Post-op pt did well. She was given potassium replacement and remained on her beta blocker. On POD #1 she had pain controlled on po meds, was tolerating diet, voided and passed gas. Pt was motivated for an early discharge.   Exam on POD #1 showed nl vitals, CV RRR, Pulm CTAB, no CVAT, well healing l'scope scars, Abd appropriately tender with no significan distension and active bowel sounds, LE w/o edema. CBC was stable.   Consults: general surgery  Treatments: surgery: TLH, b/l salpingectomy, hernia repair  Disposition: 01-Home or Self Care   Allergies as of 06/24/2017   No Known Allergies     Medication List    TAKE these medications   amLODipine 5 MG tablet Commonly known as:  NORVASC Take 5 mg by mouth daily.   aspirin-acetaminophen-caffeine 250-250-65 MG tablet Commonly known as:  EXCEDRIN MIGRAINE Take 1 tablet by mouth daily as needed for headache.   calcium carbonate 500 MG chewable tablet Commonly known as:  TUMS - dosed in mg elemental calcium Chew 2 tablets by mouth 2 (two) times daily as needed for indigestion or heartburn.   hydrochlorothiazide 25 MG tablet Commonly known as:  HYDRODIURIL Take 25 mg by mouth daily.   ibuprofen 600 MG tablet Commonly known as:  ADVIL,MOTRIN Take 1 tablet (600 mg total) by mouth every 6 (six) hours as needed (mild pain).    KLOR-CON/EF 25 MEQ disintegrating tablet Generic drug:  potassium bicarbonate Take 25 mEq by mouth 2 (two) times daily.   metoprolol tartrate 50 MG tablet Commonly known as:  LOPRESSOR Take 50 mg by mouth daily.   oxyCODONE-acetaminophen 5-325 MG tablet Commonly known as:  PERCOCET/ROXICET Take 1-2 tablets by mouth every 6 (six) hours as needed for moderate pain.   traMADol 50 MG tablet Commonly known as:  ULTRAM Take 1 tablet (50 mg total) by mouth every 6 (six) hours as needed.      Follow-up Information    Ileana Roup, MD Follow up in 2 week(s).   Specialty:  General Surgery Contact information: [place] [address] [phone]           Signed: Ala Dach, MD 06/29/2017, 11:01 AM

## 2017-09-06 DIAGNOSIS — E876 Hypokalemia: Secondary | ICD-10-CM | POA: Insufficient documentation

## 2018-02-11 IMAGING — CR DG TOE 4TH 2+V*R*
1 series · 3 of 3 positions shown · non-contrast
Comparison: None.

CLINICAL DATA: Struck right fourth toe on corner of desk. Initial
encounter.

EXAM:
RIGHT FOURTH TOE

[Series 1: dg toe 4th right · 0.14mm/px · 3 of 3 slices shown]
[im 1/3]
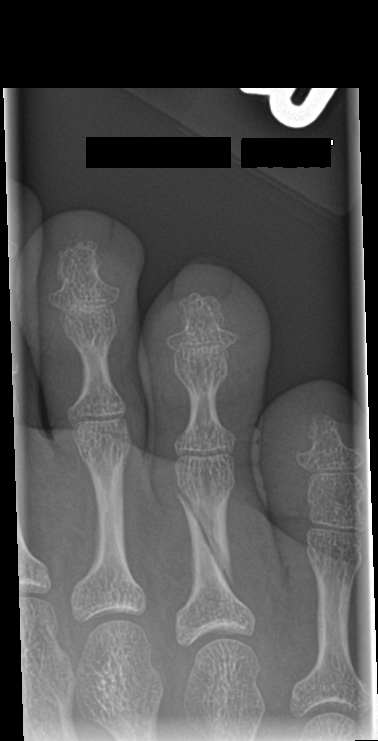
[im 2/3]
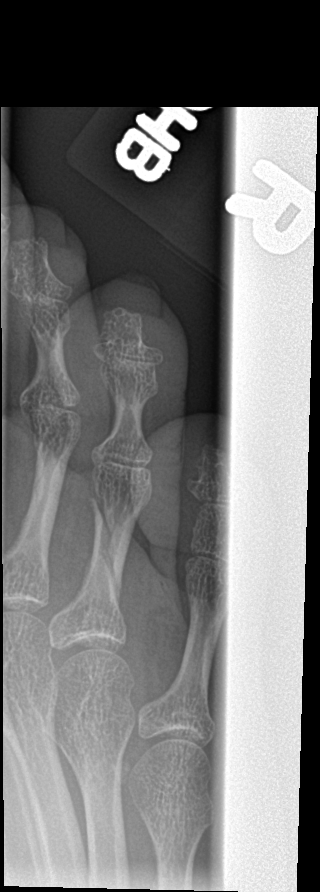
[im 3/3]
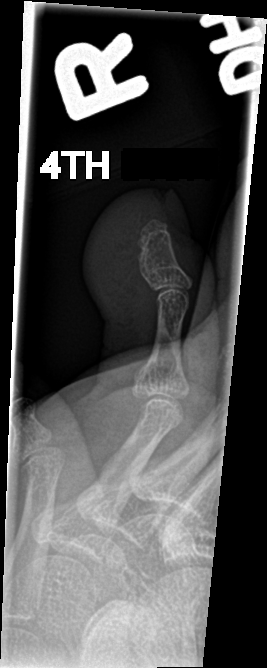

[3 of 3 positions shown; findings below may reference images not displayed]

FINDINGS: There is a mildly displaced oblique fracture through the fourth
proximal phalanx. No significant soft tissue abnormalities are
characterized. Visualized joint spaces are preserved.
IMPRESSION: Mildly displaced oblique fracture through the fourth proximal
phalanx.

## 2018-12-28 DIAGNOSIS — M278 Other specified diseases of jaws: Secondary | ICD-10-CM | POA: Diagnosis not present

## 2018-12-28 DIAGNOSIS — Z1281 Encounter for screening for malignant neoplasm of oral cavity: Secondary | ICD-10-CM | POA: Diagnosis not present

## 2018-12-28 DIAGNOSIS — J3489 Other specified disorders of nose and nasal sinuses: Secondary | ICD-10-CM | POA: Diagnosis not present

## 2018-12-28 DIAGNOSIS — K0500 Acute gingivitis, plaque induced: Secondary | ICD-10-CM | POA: Diagnosis not present

## 2019-08-06 DIAGNOSIS — Z6838 Body mass index (BMI) 38.0-38.9, adult: Secondary | ICD-10-CM | POA: Diagnosis not present

## 2019-08-06 DIAGNOSIS — I10 Essential (primary) hypertension: Secondary | ICD-10-CM | POA: Diagnosis not present

## 2019-08-06 DIAGNOSIS — Z0000 Encounter for general adult medical examination without abnormal findings: Secondary | ICD-10-CM | POA: Diagnosis not present

## 2019-08-06 DIAGNOSIS — R7303 Prediabetes: Secondary | ICD-10-CM | POA: Diagnosis not present

## 2019-08-06 DIAGNOSIS — Z1159 Encounter for screening for other viral diseases: Secondary | ICD-10-CM | POA: Diagnosis not present

## 2019-08-06 DIAGNOSIS — E876 Hypokalemia: Secondary | ICD-10-CM | POA: Diagnosis not present

## 2019-10-10 DIAGNOSIS — R2230 Localized swelling, mass and lump, unspecified upper limb: Secondary | ICD-10-CM | POA: Diagnosis not present

## 2019-10-11 DIAGNOSIS — Z118 Encounter for screening for other infectious and parasitic diseases: Secondary | ICD-10-CM | POA: Diagnosis not present

## 2019-10-11 DIAGNOSIS — Z1151 Encounter for screening for human papillomavirus (HPV): Secondary | ICD-10-CM | POA: Diagnosis not present

## 2019-10-11 DIAGNOSIS — Z113 Encounter for screening for infections with a predominantly sexual mode of transmission: Secondary | ICD-10-CM | POA: Diagnosis not present

## 2019-10-11 DIAGNOSIS — Z6838 Body mass index (BMI) 38.0-38.9, adult: Secondary | ICD-10-CM | POA: Diagnosis not present

## 2019-10-11 DIAGNOSIS — Z114 Encounter for screening for human immunodeficiency virus [HIV]: Secondary | ICD-10-CM | POA: Diagnosis not present

## 2019-10-11 DIAGNOSIS — Z1159 Encounter for screening for other viral diseases: Secondary | ICD-10-CM | POA: Diagnosis not present

## 2019-10-11 DIAGNOSIS — Z01419 Encounter for gynecological examination (general) (routine) without abnormal findings: Secondary | ICD-10-CM | POA: Diagnosis not present

## 2019-10-11 LAB — HM PAP SMEAR

## 2019-12-17 DIAGNOSIS — R7303 Prediabetes: Secondary | ICD-10-CM | POA: Diagnosis not present

## 2019-12-17 DIAGNOSIS — I10 Essential (primary) hypertension: Secondary | ICD-10-CM | POA: Diagnosis not present

## 2019-12-17 LAB — HEMOGLOBIN A1C: Hemoglobin A1C: 6.1

## 2019-12-25 DIAGNOSIS — M9905 Segmental and somatic dysfunction of pelvic region: Secondary | ICD-10-CM | POA: Diagnosis not present

## 2019-12-25 DIAGNOSIS — M5432 Sciatica, left side: Secondary | ICD-10-CM | POA: Diagnosis not present

## 2019-12-25 DIAGNOSIS — M5416 Radiculopathy, lumbar region: Secondary | ICD-10-CM | POA: Diagnosis not present

## 2019-12-25 DIAGNOSIS — M9903 Segmental and somatic dysfunction of lumbar region: Secondary | ICD-10-CM | POA: Diagnosis not present

## 2019-12-26 DIAGNOSIS — M9903 Segmental and somatic dysfunction of lumbar region: Secondary | ICD-10-CM | POA: Diagnosis not present

## 2019-12-26 DIAGNOSIS — M9905 Segmental and somatic dysfunction of pelvic region: Secondary | ICD-10-CM | POA: Diagnosis not present

## 2019-12-26 DIAGNOSIS — M5416 Radiculopathy, lumbar region: Secondary | ICD-10-CM | POA: Diagnosis not present

## 2019-12-26 DIAGNOSIS — M5432 Sciatica, left side: Secondary | ICD-10-CM | POA: Diagnosis not present

## 2019-12-28 DIAGNOSIS — M5432 Sciatica, left side: Secondary | ICD-10-CM | POA: Diagnosis not present

## 2019-12-28 DIAGNOSIS — M9903 Segmental and somatic dysfunction of lumbar region: Secondary | ICD-10-CM | POA: Diagnosis not present

## 2019-12-28 DIAGNOSIS — M9905 Segmental and somatic dysfunction of pelvic region: Secondary | ICD-10-CM | POA: Diagnosis not present

## 2019-12-28 DIAGNOSIS — M5416 Radiculopathy, lumbar region: Secondary | ICD-10-CM | POA: Diagnosis not present

## 2020-01-04 DIAGNOSIS — M5416 Radiculopathy, lumbar region: Secondary | ICD-10-CM | POA: Diagnosis not present

## 2020-01-04 DIAGNOSIS — M5432 Sciatica, left side: Secondary | ICD-10-CM | POA: Diagnosis not present

## 2020-01-04 DIAGNOSIS — M9903 Segmental and somatic dysfunction of lumbar region: Secondary | ICD-10-CM | POA: Diagnosis not present

## 2020-01-04 DIAGNOSIS — M9905 Segmental and somatic dysfunction of pelvic region: Secondary | ICD-10-CM | POA: Diagnosis not present

## 2020-01-07 DIAGNOSIS — M9903 Segmental and somatic dysfunction of lumbar region: Secondary | ICD-10-CM | POA: Diagnosis not present

## 2020-01-07 DIAGNOSIS — M5416 Radiculopathy, lumbar region: Secondary | ICD-10-CM | POA: Diagnosis not present

## 2020-01-07 DIAGNOSIS — M5432 Sciatica, left side: Secondary | ICD-10-CM | POA: Diagnosis not present

## 2020-01-07 DIAGNOSIS — M9905 Segmental and somatic dysfunction of pelvic region: Secondary | ICD-10-CM | POA: Diagnosis not present

## 2020-01-14 DIAGNOSIS — M9903 Segmental and somatic dysfunction of lumbar region: Secondary | ICD-10-CM | POA: Diagnosis not present

## 2020-01-14 DIAGNOSIS — M5416 Radiculopathy, lumbar region: Secondary | ICD-10-CM | POA: Diagnosis not present

## 2020-01-14 DIAGNOSIS — M9905 Segmental and somatic dysfunction of pelvic region: Secondary | ICD-10-CM | POA: Diagnosis not present

## 2020-01-14 DIAGNOSIS — M5432 Sciatica, left side: Secondary | ICD-10-CM | POA: Diagnosis not present

## 2020-01-18 DIAGNOSIS — M5432 Sciatica, left side: Secondary | ICD-10-CM | POA: Diagnosis not present

## 2020-01-18 DIAGNOSIS — M5416 Radiculopathy, lumbar region: Secondary | ICD-10-CM | POA: Diagnosis not present

## 2020-01-18 DIAGNOSIS — M9905 Segmental and somatic dysfunction of pelvic region: Secondary | ICD-10-CM | POA: Diagnosis not present

## 2020-01-18 DIAGNOSIS — M9903 Segmental and somatic dysfunction of lumbar region: Secondary | ICD-10-CM | POA: Diagnosis not present

## 2020-01-28 DIAGNOSIS — M5432 Sciatica, left side: Secondary | ICD-10-CM | POA: Diagnosis not present

## 2020-01-28 DIAGNOSIS — M9903 Segmental and somatic dysfunction of lumbar region: Secondary | ICD-10-CM | POA: Diagnosis not present

## 2020-01-28 DIAGNOSIS — M5416 Radiculopathy, lumbar region: Secondary | ICD-10-CM | POA: Diagnosis not present

## 2020-01-28 DIAGNOSIS — M9905 Segmental and somatic dysfunction of pelvic region: Secondary | ICD-10-CM | POA: Diagnosis not present

## 2020-02-04 DIAGNOSIS — M9903 Segmental and somatic dysfunction of lumbar region: Secondary | ICD-10-CM | POA: Diagnosis not present

## 2020-02-04 DIAGNOSIS — M5432 Sciatica, left side: Secondary | ICD-10-CM | POA: Diagnosis not present

## 2020-02-04 DIAGNOSIS — M5416 Radiculopathy, lumbar region: Secondary | ICD-10-CM | POA: Diagnosis not present

## 2020-02-04 DIAGNOSIS — M9905 Segmental and somatic dysfunction of pelvic region: Secondary | ICD-10-CM | POA: Diagnosis not present

## 2020-02-26 DIAGNOSIS — M5432 Sciatica, left side: Secondary | ICD-10-CM | POA: Diagnosis not present

## 2020-02-26 DIAGNOSIS — M5416 Radiculopathy, lumbar region: Secondary | ICD-10-CM | POA: Diagnosis not present

## 2020-02-26 DIAGNOSIS — M9905 Segmental and somatic dysfunction of pelvic region: Secondary | ICD-10-CM | POA: Diagnosis not present

## 2020-02-26 DIAGNOSIS — M9903 Segmental and somatic dysfunction of lumbar region: Secondary | ICD-10-CM | POA: Diagnosis not present

## 2020-02-29 DIAGNOSIS — M9905 Segmental and somatic dysfunction of pelvic region: Secondary | ICD-10-CM | POA: Diagnosis not present

## 2020-02-29 DIAGNOSIS — M5416 Radiculopathy, lumbar region: Secondary | ICD-10-CM | POA: Diagnosis not present

## 2020-02-29 DIAGNOSIS — M9903 Segmental and somatic dysfunction of lumbar region: Secondary | ICD-10-CM | POA: Diagnosis not present

## 2020-02-29 DIAGNOSIS — M5432 Sciatica, left side: Secondary | ICD-10-CM | POA: Diagnosis not present

## 2020-09-08 DIAGNOSIS — M9905 Segmental and somatic dysfunction of pelvic region: Secondary | ICD-10-CM | POA: Diagnosis not present

## 2020-09-08 DIAGNOSIS — M9903 Segmental and somatic dysfunction of lumbar region: Secondary | ICD-10-CM | POA: Diagnosis not present

## 2020-09-08 DIAGNOSIS — M5432 Sciatica, left side: Secondary | ICD-10-CM | POA: Diagnosis not present

## 2020-09-08 DIAGNOSIS — M5416 Radiculopathy, lumbar region: Secondary | ICD-10-CM | POA: Diagnosis not present

## 2020-09-23 DIAGNOSIS — M5416 Radiculopathy, lumbar region: Secondary | ICD-10-CM | POA: Diagnosis not present

## 2020-09-23 DIAGNOSIS — M5432 Sciatica, left side: Secondary | ICD-10-CM | POA: Diagnosis not present

## 2020-09-23 DIAGNOSIS — M9905 Segmental and somatic dysfunction of pelvic region: Secondary | ICD-10-CM | POA: Diagnosis not present

## 2020-09-23 DIAGNOSIS — M9903 Segmental and somatic dysfunction of lumbar region: Secondary | ICD-10-CM | POA: Diagnosis not present

## 2020-09-26 DIAGNOSIS — M5416 Radiculopathy, lumbar region: Secondary | ICD-10-CM | POA: Diagnosis not present

## 2020-09-26 DIAGNOSIS — M9903 Segmental and somatic dysfunction of lumbar region: Secondary | ICD-10-CM | POA: Diagnosis not present

## 2020-09-26 DIAGNOSIS — M5432 Sciatica, left side: Secondary | ICD-10-CM | POA: Diagnosis not present

## 2020-09-26 DIAGNOSIS — M9905 Segmental and somatic dysfunction of pelvic region: Secondary | ICD-10-CM | POA: Diagnosis not present

## 2020-09-29 DIAGNOSIS — M5432 Sciatica, left side: Secondary | ICD-10-CM | POA: Diagnosis not present

## 2020-09-29 DIAGNOSIS — M9903 Segmental and somatic dysfunction of lumbar region: Secondary | ICD-10-CM | POA: Diagnosis not present

## 2020-09-29 DIAGNOSIS — M5416 Radiculopathy, lumbar region: Secondary | ICD-10-CM | POA: Diagnosis not present

## 2020-09-29 DIAGNOSIS — M9905 Segmental and somatic dysfunction of pelvic region: Secondary | ICD-10-CM | POA: Diagnosis not present

## 2020-10-08 DIAGNOSIS — M5432 Sciatica, left side: Secondary | ICD-10-CM | POA: Diagnosis not present

## 2020-10-22 DIAGNOSIS — M5432 Sciatica, left side: Secondary | ICD-10-CM | POA: Diagnosis not present

## 2020-10-22 DIAGNOSIS — G5702 Lesion of sciatic nerve, left lower limb: Secondary | ICD-10-CM | POA: Diagnosis not present

## 2020-10-22 DIAGNOSIS — M7061 Trochanteric bursitis, right hip: Secondary | ICD-10-CM | POA: Diagnosis not present

## 2020-11-06 DIAGNOSIS — M25552 Pain in left hip: Secondary | ICD-10-CM | POA: Diagnosis not present

## 2020-11-06 DIAGNOSIS — M5459 Other low back pain: Secondary | ICD-10-CM | POA: Diagnosis not present

## 2020-11-10 DIAGNOSIS — M25552 Pain in left hip: Secondary | ICD-10-CM | POA: Diagnosis not present

## 2020-11-10 DIAGNOSIS — M5459 Other low back pain: Secondary | ICD-10-CM | POA: Diagnosis not present

## 2020-11-12 DIAGNOSIS — M25552 Pain in left hip: Secondary | ICD-10-CM | POA: Diagnosis not present

## 2020-11-12 DIAGNOSIS — M5459 Other low back pain: Secondary | ICD-10-CM | POA: Diagnosis not present

## 2020-11-18 DIAGNOSIS — M5459 Other low back pain: Secondary | ICD-10-CM | POA: Diagnosis not present

## 2020-11-18 DIAGNOSIS — M25552 Pain in left hip: Secondary | ICD-10-CM | POA: Diagnosis not present

## 2020-11-24 DIAGNOSIS — M5459 Other low back pain: Secondary | ICD-10-CM | POA: Diagnosis not present

## 2020-11-24 DIAGNOSIS — M25552 Pain in left hip: Secondary | ICD-10-CM | POA: Diagnosis not present

## 2020-11-26 DIAGNOSIS — M5459 Other low back pain: Secondary | ICD-10-CM | POA: Diagnosis not present

## 2020-11-26 DIAGNOSIS — M25552 Pain in left hip: Secondary | ICD-10-CM | POA: Diagnosis not present

## 2020-11-27 ENCOUNTER — Other Ambulatory Visit: Payer: Self-pay

## 2020-11-27 ENCOUNTER — Encounter: Payer: Self-pay | Admitting: Family Medicine

## 2020-11-27 ENCOUNTER — Ambulatory Visit: Payer: Federal, State, Local not specified - PPO | Admitting: Family Medicine

## 2020-11-27 VITALS — BP 128/80 | HR 68 | Ht 67.0 in | Wt 251.0 lb

## 2020-11-27 DIAGNOSIS — G5702 Lesion of sciatic nerve, left lower limb: Secondary | ICD-10-CM

## 2020-11-27 DIAGNOSIS — M5432 Sciatica, left side: Secondary | ICD-10-CM

## 2020-11-27 DIAGNOSIS — R7303 Prediabetes: Secondary | ICD-10-CM | POA: Diagnosis not present

## 2020-11-27 DIAGNOSIS — E876 Hypokalemia: Secondary | ICD-10-CM | POA: Diagnosis not present

## 2020-11-27 DIAGNOSIS — Z7689 Persons encountering health services in other specified circumstances: Secondary | ICD-10-CM

## 2020-11-27 DIAGNOSIS — I10 Essential (primary) hypertension: Secondary | ICD-10-CM

## 2020-11-27 DIAGNOSIS — R5383 Other fatigue: Secondary | ICD-10-CM

## 2020-11-27 MED ORDER — MELOXICAM 7.5 MG PO TABS: 7.5000 mg | ORAL_TABLET | Freq: Every day | ORAL | 0 refills | Status: DC

## 2020-11-27 NOTE — Progress Notes (Signed)
Date:  11/27/2020   Name:  Lorraine Gibson   DOB:  Aug 05, 1980   MRN:  [id number]   Chief Complaint: Establish Care (Wanted new doctor)  Patient is a 40 year old female who presents for a establish care exam. The patient reports the following problems: sciatica/. Health maintenance has been reviewed up to date.    Diabetes She presents for her follow-up diabetic visit. She has type 2 diabetes mellitus. There are no hypoglycemic associated symptoms. Pertinent negatives for hypoglycemia include no dizziness, headaches, nervousness/anxiousness or sweats. Associated symptoms include fatigue. Pertinent negatives for diabetes include no blurred vision, no chest pain, no foot paresthesias, no foot ulcerations, no polydipsia, no polyphagia, no polyuria, no visual change and no weight loss. (nocturia) Symptoms are stable. Risk factors for coronary artery disease include hypertension and dyslipidemia. Current diabetic treatment includes oral agent (monotherapy). She is compliant with treatment most of the time. An ACE inhibitor/angiotensin II receptor blocker is being taken.  Hypertension This is a chronic problem. The current episode started more than 1 year ago. The problem has been gradually worsening since onset. The problem is controlled. Pertinent negatives include no anxiety, blurred vision, chest pain, headaches, malaise/fatigue, neck pain, orthopnea, palpitations, peripheral edema, PND, shortness of breath or sweats. Risk factors for coronary artery disease include dyslipidemia. Past treatments include beta blockers, calcium channel blockers and angiotensin blockers. The current treatment provides moderate improvement. There are no compliance problems.  There is no history of chronic renal disease, a hypertension causing med or renovascular disease.  Back Pain This is a chronic problem. The current episode started more than 1 year ago. The problem occurs intermittently. The problem has been waxing and  waning since onset. The quality of the pain is described as aching (left buttock). Pertinent negatives include no abdominal pain, chest pain, dysuria, fever, headaches or weight loss.   Lab Results  Component Value Date   CREATININE 0.69 06/24/2017   BUN 11 06/24/2017   NA 137 06/24/2017   K 3.5 06/24/2017   CL 105 06/24/2017   CO2 21 (L) 06/24/2017   No results found for: CHOL, HDL, LDLCALC, LDLDIRECT, TRIG, CHOLHDL No results found for: TSH Lab Results  Component Value Date   HGBA1C 6.1 12/17/2019   Lab Results  Component Value Date   WBC 12.8 (H) 06/24/2017   HGB 10.2 (L) 06/24/2017   HCT 31.5 (L) 06/24/2017   MCV 80.2 06/24/2017   PLT 289 06/24/2017   Lab Results  Component Value Date   ALT 16 06/24/2017   AST 38 06/24/2017   ALKPHOS 38 06/24/2017   BILITOT 0.7 06/24/2017     Review of Systems  Constitutional:  Positive for fatigue. Negative for chills, fever, malaise/fatigue and weight loss.  HENT:  Negative for drooling, ear discharge, ear pain and sore throat.   Eyes:  Negative for blurred vision.  Respiratory:  Negative for cough, shortness of breath and wheezing.   Cardiovascular:  Negative for chest pain, palpitations, orthopnea, leg swelling and PND.  Gastrointestinal:  Negative for abdominal pain, blood in stool, constipation, diarrhea and nausea.  Endocrine: Negative for polydipsia, polyphagia and polyuria.  Genitourinary:  Negative for dysuria, frequency, hematuria and urgency.  Musculoskeletal:  Positive for back pain. Negative for myalgias and neck pain.  Skin:  Negative for rash.  Allergic/Immunologic: Negative for environmental allergies.  Neurological:  Negative for dizziness and headaches.  Hematological:  Does not bruise/bleed easily.  Psychiatric/Behavioral:  Negative for suicidal ideas. The patient is  not nervous/anxious.    Patient Active Problem List   Diagnosis Date Noted   Fibroids 06/23/2017   Postpartum care following cesarean delivery  (1/8) 06/30/2015   Third trimester bleeding 06/29/2015    No Known Allergies  Past Surgical History:  Procedure Laterality Date   ABDOMINAL CERCLAGE     ANKLE SURGERY     CERVICAL CERCLAGE     CESAREAN SECTION     CESAREAN SECTION N/A 06/29/2015   Procedure: CESAREAN SECTION;  Surgeon: Princess Bruins, MD;  Location: [facility];  Service: Obstetrics;  Laterality: N/A;  Most likely surgery after %PM - will be Dr Ronita Hipps instead of Lavoie ATE full meal 1400   FOOT SURGERY     LEEP     ROBOTIC ASSISTED TOTAL HYSTERECTOMY WITH SALPINGECTOMY Bilateral 06/23/2017   Procedure: ROBOTIC ASSISTED TOTAL HYSTERECTOMY WITH SALPINGECTOMY;  Surgeon: Aloha Gell, MD;  Location: [facility];  Service: Gynecology;  Laterality: Bilateral;   UMBILICAL HERNIA REPAIR N/A 06/23/2017   Procedure: HERNIA REPAIR UMBILICAL ADULT;  Surgeon: Ileana Roup, MD;  Location: [facility];  Service: General;  Laterality: N/A;    Social History   Tobacco Use   Smoking status: Never   Smokeless tobacco: Never  Vaping Use   Vaping Use: Never used  Substance Use Topics   Alcohol use: Yes    Comment: occasional   Drug use: No     Medication list has been reviewed and updated.  Current Meds  Medication Sig   amLODipine (NORVASC) 5 MG tablet Take 5 mg by mouth daily.   calcium carbonate (TUMS - DOSED IN MG ELEMENTAL CALCIUM) 500 MG chewable tablet Chew 2 tablets by mouth 2 (two) times daily as needed for indigestion or heartburn.    CINNAMON PO Take 1 each by mouth daily.   diclofenac Sodium (VOLTAREN) 1 % GEL Apply 2 g topically 2 (two) times daily.   losartan (COZAAR) 50 MG tablet Take 1 tablet by mouth daily.   MAGNESIUM PO Take 1 tablet by mouth daily.   metFORMIN (GLUCOPHAGE-XR) 750 MG 24 hr tablet Take 750 mg by mouth daily.   metoprolol tartrate (LOPRESSOR) 50 MG tablet Take 50 mg by mouth daily.   Potassium Gluconate 550 MG TABS Take 1 tablet by mouth daily. otc   TURMERIC PO Take 1 tablet by mouth daily.    [DISCONTINUED] aspirin-acetaminophen-caffeine (EXCEDRIN MIGRAINE) 250-250-65 MG tablet Take 1 tablet by mouth daily as needed for headache.    PHQ 2/9 Scores 11/27/2020 05/27/2015  PHQ - 2 Score 1 0  PHQ- 9 Score 3 -    GAD 7 : Generalized Anxiety Score 11/27/2020  Nervous, Anxious, on Edge 2  Control/stop worrying 2  Worry too much - different things 1  Trouble relaxing 0  Restless 0  Easily annoyed or irritable 0  Afraid - awful might happen 0  Total GAD 7 Score 5  Anxiety Difficulty Not difficult at all    BP Readings from Last 3 Encounters:  11/27/20 128/80  06/24/17 124/77  06/16/17 106/77    Physical Exam Vitals and nursing note reviewed.  Constitutional:      General: She is not in acute distress.    Appearance: She is not diaphoretic.  HENT:     Head: Normocephalic and atraumatic.     Right Ear: Tympanic membrane and external ear normal. There is no impacted cerumen.     Left Ear: Tympanic membrane and external ear normal. There is no impacted cerumen.  Nose: Nose normal. No congestion or rhinorrhea.  Eyes:     General:        Right eye: No discharge.        Left eye: No discharge.     Conjunctiva/sclera: Conjunctivae normal.     Pupils: Pupils are equal, round, and reactive to light.  Neck:     Thyroid: No thyromegaly.     Vascular: No JVD.  Cardiovascular:     Rate and Rhythm: Normal rate and regular rhythm.     Heart sounds: Normal heart sounds. No murmur heard.   No friction rub. No gallop.  Pulmonary:     Effort: Pulmonary effort is normal. No respiratory distress.     Breath sounds: Normal breath sounds. No stridor. No wheezing or rhonchi.  Abdominal:     General: Bowel sounds are normal.     Palpations: Abdomen is soft. There is no mass.     Tenderness: There is no abdominal tenderness. There is no guarding.  Musculoskeletal:        General: Normal range of motion.     Cervical back: Normal range of motion and neck supple.  Lymphadenopathy:      Cervical: No cervical adenopathy.  Skin:    General: Skin is warm and dry.     Coloration: Skin is not pale.  Neurological:     Mental Status: She is alert.     Deep Tendon Reflexes: Reflexes are normal and symmetric.    Wt Readings from Last 3 Encounters:  11/27/20 251 lb (113.9 kg)  06/24/17 243 lb 0.5 oz (110.2 kg)  06/16/17 240 lb (108.9 kg)    BP 128/80   Pulse 68   Ht 5\' 7"  (1.702 m)   Wt 251 lb (113.9 kg)   LMP 05/31/2017 (Exact Date)   BMI 39.31 kg/m   Assessment and Plan: 1. Establishing care with new doctor, encounter for Patient establishing care with new physician.  Diagnoses below are acknowledged by patient has medications and does not need refills but we will have on file so we can do lab work.  2. Primary hypertension Chronic.  Controlled.  Stable.  Blood pressure today is controlled at 128/80.  We will get a CMP panel.  Patient is returning in the morning fasting. - Comprehensive metabolic panel  3. Prediabetes Chronic.  Controlled.  Stable.  Patient is returning in the morning for A1c and lipid panel.  In the meantime she will continue her present dosing of medication - Hemoglobin A1c - Lipid Panel With LDL/HDL Ratio  4. Hypokalemia Patient has a long history of hypokalemia that I am wondering if there may be an aldosterone issue.  We will check a CMP to see what her current potassium is and adjust accordingly.  5. Pyriformis syndrome, left Chronic.  Controlled.  Stable.  History of piriformis syndrome we will begin with meloxicam 7.5 once a day. - meloxicam (MOBIC) 7.5 MG tablet; Take 1 tablet (7.5 mg total) by mouth daily.  Dispense: 30 tablet; Refill: 0  6. Sciatica of left side This may also be involved with sciatica,.  We will use the NSAID for this as well. - meloxicam (MOBIC) 7.5 MG tablet; Take 1 tablet (7.5 mg total) by mouth daily.  Dispense: 30 tablet; Refill: 0  7. Fatigue, unspecified type During review of systems patient notes that she  has had some fatigue over the past several months which may be related to her thyroid and we will check a TSH for  screening purposes. - TSH

## 2020-11-28 DIAGNOSIS — I10 Essential (primary) hypertension: Secondary | ICD-10-CM | POA: Diagnosis not present

## 2020-11-28 DIAGNOSIS — R5383 Other fatigue: Secondary | ICD-10-CM | POA: Diagnosis not present

## 2020-11-28 DIAGNOSIS — R7303 Prediabetes: Secondary | ICD-10-CM | POA: Diagnosis not present

## 2020-11-29 LAB — COMPREHENSIVE METABOLIC PANEL
ALT: 45 IU/L — ABNORMAL HIGH (ref 0–32)
AST: 31 IU/L (ref 0–40)
Albumin/Globulin Ratio: 1.6 (ref 1.2–2.2)
Albumin: 4.4 g/dL (ref 3.8–4.8)
Alkaline Phosphatase: 66 IU/L (ref 44–121)
BUN/Creatinine Ratio: 14 (ref 9–23)
BUN: 11 mg/dL (ref 6–24)
Bilirubin Total: 0.5 mg/dL (ref 0.0–1.2)
CO2: 25 mmol/L (ref 20–29)
Calcium: 9 mg/dL (ref 8.7–10.2)
Chloride: 100 mmol/L (ref 96–106)
Creatinine, Ser: 0.77 mg/dL (ref 0.57–1.00)
Globulin, Total: 2.7 g/dL (ref 1.5–4.5)
Glucose: 92 mg/dL (ref 65–99)
Potassium: 3.4 mmol/L — ABNORMAL LOW (ref 3.5–5.2)
Sodium: 142 mmol/L (ref 134–144)
Total Protein: 7.1 g/dL (ref 6.0–8.5)
eGFR: 100 mL/min/{1.73_m2} (ref >59)

## 2020-11-29 LAB — TSH: TSH: 1.5 u[IU]/mL (ref 0.450–4.500)

## 2020-11-29 LAB — LIPID PANEL WITH LDL/HDL RATIO
Cholesterol, Total: 174 mg/dL (ref 100–199)
HDL: 36 mg/dL — ABNORMAL LOW (ref >39)
LDL Chol Calc (NIH): 113 mg/dL — ABNORMAL HIGH (ref 0–99)
LDL/HDL Ratio: 3.1 ratio (ref 0.0–3.2)
Triglycerides: 137 mg/dL (ref 0–149)
VLDL Cholesterol Cal: 25 mg/dL (ref 5–40)

## 2020-11-29 LAB — HEMOGLOBIN A1C
Est. average glucose Bld gHb Est-mCnc: 131 mg/dL
Hgb A1c MFr Bld: 6.2 % — ABNORMAL HIGH (ref 4.8–5.6)

## 2020-12-01 DIAGNOSIS — M5459 Other low back pain: Secondary | ICD-10-CM | POA: Diagnosis not present

## 2020-12-01 DIAGNOSIS — M25552 Pain in left hip: Secondary | ICD-10-CM | POA: Diagnosis not present

## 2020-12-27 ENCOUNTER — Other Ambulatory Visit: Payer: Self-pay | Admitting: Family Medicine

## 2020-12-27 DIAGNOSIS — G5702 Lesion of sciatic nerve, left lower limb: Secondary | ICD-10-CM

## 2020-12-27 DIAGNOSIS — M5432 Sciatica, left side: Secondary | ICD-10-CM

## 2020-12-27 NOTE — Telephone Encounter (Signed)
Future visit in 3 months  

## 2021-02-13 DIAGNOSIS — Z01419 Encounter for gynecological examination (general) (routine) without abnormal findings: Secondary | ICD-10-CM | POA: Diagnosis not present

## 2021-02-13 DIAGNOSIS — Z6838 Body mass index (BMI) 38.0-38.9, adult: Secondary | ICD-10-CM | POA: Diagnosis not present

## 2021-02-13 DIAGNOSIS — Z118 Encounter for screening for other infectious and parasitic diseases: Secondary | ICD-10-CM | POA: Diagnosis not present

## 2021-02-13 DIAGNOSIS — Z113 Encounter for screening for infections with a predominantly sexual mode of transmission: Secondary | ICD-10-CM | POA: Diagnosis not present

## 2021-02-13 DIAGNOSIS — Z1159 Encounter for screening for other viral diseases: Secondary | ICD-10-CM | POA: Diagnosis not present

## 2021-02-13 DIAGNOSIS — Z1231 Encounter for screening mammogram for malignant neoplasm of breast: Secondary | ICD-10-CM | POA: Diagnosis not present

## 2021-02-13 DIAGNOSIS — Z114 Encounter for screening for human immunodeficiency virus [HIV]: Secondary | ICD-10-CM | POA: Diagnosis not present

## 2021-03-30 ENCOUNTER — Other Ambulatory Visit: Payer: Self-pay

## 2021-03-30 ENCOUNTER — Ambulatory Visit: Payer: Federal, State, Local not specified - PPO | Admitting: Family Medicine

## 2021-03-30 ENCOUNTER — Encounter: Payer: Self-pay | Admitting: Family Medicine

## 2021-03-30 VITALS — BP 128/84 | HR 75 | Ht 67.0 in | Wt 244.0 lb

## 2021-03-30 DIAGNOSIS — R7303 Prediabetes: Secondary | ICD-10-CM

## 2021-03-30 DIAGNOSIS — I10 Essential (primary) hypertension: Secondary | ICD-10-CM | POA: Diagnosis not present

## 2021-03-30 DIAGNOSIS — Z789 Other specified health status: Secondary | ICD-10-CM

## 2021-03-30 DIAGNOSIS — G5702 Lesion of sciatic nerve, left lower limb: Secondary | ICD-10-CM | POA: Insufficient documentation

## 2021-03-30 DIAGNOSIS — M5432 Sciatica, left side: Secondary | ICD-10-CM | POA: Insufficient documentation

## 2021-03-30 MED ORDER — METFORMIN HCL ER 500 MG PO TB24: 500.0000 mg | ORAL_TABLET | Freq: Every day | ORAL | 1 refills | Status: DC

## 2021-03-30 MED ORDER — LOSARTAN POTASSIUM 50 MG PO TABS: 50.0000 mg | ORAL_TABLET | Freq: Every day | ORAL | 1 refills | Status: DC

## 2021-03-30 MED ORDER — AMLODIPINE BESYLATE 5 MG PO TABS: 5.0000 mg | ORAL_TABLET | Freq: Every day | ORAL | 1 refills | Status: DC

## 2021-03-30 MED ORDER — METOPROLOL TARTRATE 50 MG PO TABS: 50.0000 mg | ORAL_TABLET | Freq: Every day | ORAL | 1 refills | Status: DC

## 2021-03-30 NOTE — Progress Notes (Signed)
Date:  03/30/2021   Name:  Lorraine Gibson   DOB:  1981-03-25   MRN:  [id number]   Chief Complaint: Hypertension  Hypertension This is a chronic problem. The current episode started more than 1 year ago. The problem has been waxing and waning since onset. The problem is controlled. Associated symptoms include palpitations. Pertinent negatives include no anxiety, blurred vision, chest pain, headaches, malaise/fatigue, neck pain, orthopnea, peripheral edema, PND, shortness of breath or sweats. (Hx sinus tachcardia) Past treatments include angiotensin blockers, calcium channel blockers and beta blockers. The current treatment provides moderate improvement. There is no history of angina, kidney disease, CAD/MI, CVA, heart failure, left ventricular hypertrophy, PVD or retinopathy. There is no history of chronic renal disease, a hypertension causing med or renovascular disease.  Diabetes She presents for her follow-up diabetic visit. Her disease course has been stable. There are no hypoglycemic associated symptoms. Pertinent negatives for hypoglycemia include no dizziness, headaches, nervousness/anxiousness or sweats. Pertinent negatives for diabetes include no blurred vision, no chest pain, no fatigue, no foot paresthesias, no foot ulcerations, no polydipsia, no polyphagia, no polyuria, no visual change, no weakness and no weight loss. Symptoms are stable. Pertinent negatives for diabetic complications include no CVA, PVD or retinopathy. Current diabetic treatment includes oral agent (monotherapy). She is following a generally healthy diet. She participates in exercise daily. An ACE inhibitor/angiotensin II receptor blocker is being taken. Eye exam is current.   Lab Results  Component Value Date   CREATININE 0.77 11/28/2020   BUN 11 11/28/2020   NA 142 11/28/2020   K 3.4 (L) 11/28/2020   CL 100 11/28/2020   CO2 25 11/28/2020   Lab Results  Component Value Date   CHOL 174 11/28/2020   HDL 36  (L) 11/28/2020   LDLCALC 113 (H) 11/28/2020   TRIG 137 11/28/2020   Lab Results  Component Value Date   TSH 1.500 11/28/2020   Lab Results  Component Value Date   HGBA1C 6.2 (H) 11/28/2020   Lab Results  Component Value Date   WBC 12.8 (H) 06/24/2017   HGB 10.2 (L) 06/24/2017   HCT 31.5 (L) 06/24/2017   MCV 80.2 06/24/2017   PLT 289 06/24/2017   Lab Results  Component Value Date   ALT 45 (H) 11/28/2020   AST 31 11/28/2020   ALKPHOS 66 11/28/2020   BILITOT 0.5 11/28/2020     Review of Systems  Constitutional:  Negative for chills, fatigue, fever, malaise/fatigue and weight loss.  HENT:  Negative for drooling, ear discharge, ear pain and sore throat.   Eyes:  Negative for blurred vision.  Respiratory:  Negative for cough, shortness of breath and wheezing.   Cardiovascular:  Positive for palpitations. Negative for chest pain, orthopnea, leg swelling and PND.  Gastrointestinal:  Negative for abdominal pain, blood in stool, constipation, diarrhea and nausea.  Endocrine: Negative for polydipsia, polyphagia and polyuria.  Genitourinary:  Negative for dysuria, frequency, hematuria and urgency.  Musculoskeletal:  Negative for back pain, myalgias and neck pain.  Skin:  Negative for rash.  Allergic/Immunologic: Negative for environmental allergies.  Neurological:  Negative for dizziness, weakness and headaches.  Hematological:  Does not bruise/bleed easily.  Psychiatric/Behavioral:  Negative for suicidal ideas. The patient is not nervous/anxious.    Patient Active Problem List   Diagnosis Date Noted   Hypokalemia 09/06/2017   Fibroids 06/23/2017   Essential hypertension 10/11/2015   Postpartum care following cesarean delivery (1/8) 06/30/2015   Third trimester bleeding 06/29/2015  Anemia affecting pregnancy in first trimester 12/24/2014   Subserous leiomyoma of uterus 12/11/2014   Cervical incompetence with baby delivered 09/16/2014    No Known Allergies  Past  Surgical History:  Procedure Laterality Date   ABDOMINAL CERCLAGE     ANKLE SURGERY     CERVICAL CERCLAGE     CESAREAN SECTION     CESAREAN SECTION N/A 06/29/2015   Procedure: CESAREAN SECTION;  Surgeon: Princess Bruins, MD;  Location: [facility];  Service: Obstetrics;  Laterality: N/A;  Most likely surgery after %PM - will be Dr Ronita Hipps instead of Lavoie ATE full meal 1400   FOOT SURGERY     LEEP     ROBOTIC ASSISTED TOTAL HYSTERECTOMY WITH SALPINGECTOMY Bilateral 06/23/2017   Procedure: ROBOTIC ASSISTED TOTAL HYSTERECTOMY WITH SALPINGECTOMY;  Surgeon: Aloha Gell, MD;  Location: [facility];  Service: Gynecology;  Laterality: Bilateral;   UMBILICAL HERNIA REPAIR N/A 06/23/2017   Procedure: HERNIA REPAIR UMBILICAL ADULT;  Surgeon: Ileana Roup, MD;  Location: [facility];  Service: General;  Laterality: N/A;    Social History   Tobacco Use   Smoking status: Never   Smokeless tobacco: Never  Vaping Use   Vaping Use: Never used  Substance Use Topics   Alcohol use: Yes    Comment: occasional   Drug use: No     Medication list has been reviewed and updated.  Current Meds  Medication Sig   amLODipine (NORVASC) 5 MG tablet Take 5 mg by mouth daily.   calcium carbonate (TUMS - DOSED IN MG ELEMENTAL CALCIUM) 500 MG chewable tablet Chew 2 tablets by mouth 2 (two) times daily as needed for indigestion or heartburn.    CINNAMON PO Take 1 each by mouth daily.   losartan (COZAAR) 50 MG tablet Take 1 tablet by mouth daily.   MAGNESIUM PO Take 1 tablet by mouth daily.   meloxicam (MOBIC) 7.5 MG tablet TAKE 1 TABLET(7.5 MG) BY MOUTH DAILY   metFORMIN (GLUCOPHAGE-XR) 750 MG 24 hr tablet Take 750 mg by mouth daily.   metoprolol tartrate (LOPRESSOR) 50 MG tablet Take 50 mg by mouth daily.   Potassium Gluconate 550 MG TABS Take 1 tablet by mouth daily. otc   TURMERIC PO Take 1 tablet by mouth daily.    PHQ 2/9 Scores 03/30/2021 11/27/2020 05/27/2015  PHQ - 2 Score 0 1 0  PHQ- 9 Score 0 3 -     GAD 7 : Generalized Anxiety Score 11/27/2020  Nervous, Anxious, on Edge 2  Control/stop worrying 2  Worry too much - different things 1  Trouble relaxing 0  Restless 0  Easily annoyed or irritable 0  Afraid - awful might happen 0  Total GAD 7 Score 5  Anxiety Difficulty Not difficult at all    BP Readings from Last 3 Encounters:  03/30/21 128/84  11/27/20 128/80  06/24/17 124/77    Physical Exam Vitals and nursing note reviewed.  Constitutional:      General: She is not in acute distress.    Appearance: She is not diaphoretic.  HENT:     Head: Normocephalic and atraumatic.     Right Ear: External ear normal.     Left Ear: External ear normal.     Nose: Nose normal.  Eyes:     General:        Right eye: No discharge.        Left eye: No discharge.     Conjunctiva/sclera: Conjunctivae normal.     Pupils:  Pupils are equal, round, and reactive to light.  Neck:     Thyroid: No thyromegaly.     Vascular: No JVD.  Cardiovascular:     Rate and Rhythm: Normal rate and regular rhythm.     Heart sounds: Normal heart sounds. No murmur heard.   No friction rub. No gallop.  Pulmonary:     Effort: Pulmonary effort is normal.     Breath sounds: Normal breath sounds.  Abdominal:     General: Bowel sounds are normal.     Palpations: Abdomen is soft. There is no mass.     Tenderness: There is no abdominal tenderness. There is no guarding.  Musculoskeletal:        General: Normal range of motion.     Cervical back: Normal range of motion and neck supple.  Lymphadenopathy:     Cervical: No cervical adenopathy.  Skin:    General: Skin is warm and dry.  Neurological:     Mental Status: She is alert.     Deep Tendon Reflexes: Reflexes are normal and symmetric.    Wt Readings from Last 3 Encounters:  03/30/21 244 lb (110.7 kg)  11/27/20 251 lb (113.9 kg)  06/24/17 243 lb 0.5 oz (110.2 kg)    BP 128/84   Pulse 75   Ht 5\' 7"  (1.702 m)   Wt 244 lb (110.7 kg)   LMP  05/31/2017 (Exact Date)   SpO2 99%   BMI 38.22 kg/m   Assessment and Plan:  1. Primary hypertension Chronic.  Controlled.  Stable.  Blood pressure 128/84.  We will continue trio of amlodipine, metoprolol, and losartan.  Blood pressure is in excellent range and we will recheck renal function panel in 6 weeks at the time she returns for an A1c  2. Prediabetes Chronic.  Controlled.  Stable.  Patient has not been able to tolerate the Glucophage Exar 750 due to diarrhea.  We will decrease to 500 mg XR once a day and will will check A1c in 6 weeks.  3. Medication intolerance Patient has intolerance of metformin at 750 XR dosing.  Hopefully reduction 500 mg XR will be tolerated.

## 2021-04-01 ENCOUNTER — Other Ambulatory Visit: Payer: Self-pay | Admitting: Family Medicine

## 2021-04-01 MED ORDER — METFORMIN HCL ER 500 MG PO TB24: 500.0000 mg | ORAL_TABLET | Freq: Every day | ORAL | 1 refills | Status: DC

## 2021-04-01 NOTE — Telephone Encounter (Signed)
Copied from [place] [id number]. Topic: Quick Communication - Rx Refill/Question >> Apr 01, 2021  3:16 PM Yvette Rack wrote: Pt stated she went to pick up her Rx but she was informed by her pharmacy that the Rx had closed out and she needed to contact provider.  Medication: metFORMIN (GLUCOPHAGE-XR) 500 MG 24 hr tablet  Has the patient contacted their pharmacy? Yes.   (Agent: If no, request that the patient contact the pharmacy for the refill.) (Agent: If yes, when and what did the pharmacy advise?)  Preferred Pharmacy (with phone number or street name): [id number] - [place], [address] AT [place] Phone: [phone]  Fax: [phone]   Has the patient been seen for an appointment in the last year OR does the patient have an upcoming appointment? Yes.    Agent: Please be advised that RX refills may take up to 3 business days. We ask that you follow-up with your pharmacy.

## 2021-04-01 NOTE — Telephone Encounter (Signed)
[place] called and spoke to Gamaliel about the refill(s) Metformin XR 500mg  requested. Advised it was sent on 03/30/21 #30/1 refill(s). She stated fax was sent in but paper was blank so unable to refill and was closed out by pharmacy. Advised that this will be sent in electronically.

## 2021-04-01 NOTE — Telephone Encounter (Signed)
Requested Prescriptions  Pending Prescriptions Disp Refills  . metFORMIN (GLUCOPHAGE-XR) 500 MG 24 hr tablet 30 tablet 1    Sig: Take 1 tablet (500 mg total) by mouth daily with breakfast.     Endocrinology:  Diabetes - Biguanides Failed - 04/01/2021  5:35 PM      Failed - AA eGFR in normal range and within 360 days    GFR calc Af Amer  Date Value Ref Range Status  06/24/2017 >60 >60 mL/min Final    Comment:    (NOTE) The eGFR has been calculated using the CKD EPI equation. This calculation has not been validated in all clinical situations. eGFR's persistently <60 mL/min signify possible Chronic Kidney Disease.    GFR calc non Af Amer  Date Value Ref Range Status  06/24/2017 >60 >60 mL/min Final   eGFR  Date Value Ref Range Status  11/28/2020 100 >59 mL/min/1.73 Final         Passed - Cr in normal range and within 360 days    Creatinine, Ser  Date Value Ref Range Status  11/28/2020 0.77 0.57 - 1.00 mg/dL Final   Creatinine, Urine  Date Value Ref Range Status  06/29/2015 173.00 mg/dL Final         Passed - HBA1C is between 0 and 7.9 and within 180 days    Hemoglobin A1C  Date Value Ref Range Status  12/17/2019 6.1  Final   Hgb A1c MFr Bld  Date Value Ref Range Status  11/28/2020 6.2 (H) 4.8 - 5.6 % Final    Comment:             Prediabetes: 5.7 - 6.4          Diabetes: >6.4          Glycemic control for adults with diabetes: <7.0          Passed - Valid encounter within last 6 months    Recent Outpatient Visits          2 days ago Primary hypertension   [place], MD   4 months ago Establishing care with new doctor, encounter for   [place], MD      Future Appointments            In 1 month Juline Patch, MD [facility], [facility]

## 2021-04-27 ENCOUNTER — Ambulatory Visit: Payer: Federal, State, Local not specified - PPO | Admitting: Family Medicine

## 2021-04-27 ENCOUNTER — Encounter: Payer: Self-pay | Admitting: Family Medicine

## 2021-04-27 ENCOUNTER — Other Ambulatory Visit: Payer: Self-pay

## 2021-04-27 ENCOUNTER — Telehealth: Payer: Self-pay

## 2021-04-27 VITALS — BP 130/80 | HR 68 | Ht 67.0 in | Wt 242.0 lb

## 2021-04-27 DIAGNOSIS — H1033 Unspecified acute conjunctivitis, bilateral: Secondary | ICD-10-CM | POA: Diagnosis not present

## 2021-04-27 DIAGNOSIS — J0100 Acute maxillary sinusitis, unspecified: Secondary | ICD-10-CM

## 2021-04-27 DIAGNOSIS — R051 Acute cough: Secondary | ICD-10-CM

## 2021-04-27 DIAGNOSIS — Z20828 Contact with and (suspected) exposure to other viral communicable diseases: Secondary | ICD-10-CM | POA: Diagnosis not present

## 2021-04-27 MED ORDER — GUAIFENESIN-CODEINE 100-10 MG/5ML PO SYRP: 5.0000 mL | ORAL_SOLUTION | Freq: Three times a day (TID) | ORAL | 0 refills | Status: DC | PRN

## 2021-04-27 MED ORDER — TOBRAMYCIN-DEXAMETHASONE 0.3-0.1 % OP SUSP: 2.0000 [drp] | OPHTHALMIC | 0 refills | Status: DC

## 2021-04-27 MED ORDER — AMOXICILLIN-POT CLAVULANATE 875-125 MG PO TABS: 1.0000 | ORAL_TABLET | Freq: Two times a day (BID) | ORAL | 0 refills | Status: DC

## 2021-04-27 NOTE — Telephone Encounter (Signed)
Copied from [place] [id number]. Topic: General - Other >> Apr 27, 2021 10:50 AM Erick Blinks wrote: Reason for CRM: Pt called back to report that she is covid negative. Tested today, also confirmed her appt.

## 2021-04-27 NOTE — Progress Notes (Signed)
Date:  04/27/2021   Name:  Lorraine Gibson   DOB:  [birth year]   MRN:  [id number]   Chief Complaint: Cough (And congestion x 1 weeks with green production. Can't sleep at night) and Eye Problem (Red eye, matted this am- L) eye)  Cough This is a new problem. The current episode started in the past 7 days. The problem has been waxing and waning. The cough is Productive of purulent sputum. Associated symptoms include eye redness, headaches, myalgias, nasal congestion, postnasal drip, a sore throat and wheezing. Pertinent negatives include no chest pain, chills, ear pain, fever, heartburn, hemoptysis, rash or shortness of breath. There is no history of environmental allergies.  Eye Problem  Both eyes are affected. This is a new problem. The current episode started today. The problem has been gradually worsening. The pain is moderate. Associated symptoms include eye redness. Pertinent negatives include no blurred vision, eye discharge, double vision, fever, foreign body sensation, itching, nausea, photophobia, recent URI or vomiting. She has tried nothing for the symptoms.   Lab Results  Component Value Date   CREATININE 0.77 11/28/2020   BUN 11 11/28/2020   NA 142 11/28/2020   K 3.4 (L) 11/28/2020   CL 100 11/28/2020   CO2 25 11/28/2020   Lab Results  Component Value Date   CHOL 174 11/28/2020   HDL 36 (L) 11/28/2020   LDLCALC 113 (H) 11/28/2020   TRIG 137 11/28/2020   Lab Results  Component Value Date   TSH 1.500 11/28/2020   Lab Results  Component Value Date   HGBA1C 6.2 (H) 11/28/2020   Lab Results  Component Value Date   WBC 12.8 (H) 06/24/2017   HGB 10.2 (L) 06/24/2017   HCT 31.5 (L) 06/24/2017   MCV 80.2 06/24/2017   PLT 289 06/24/2017   Lab Results  Component Value Date   ALT 45 (H) 11/28/2020   AST 31 11/28/2020   ALKPHOS 66 11/28/2020   BILITOT 0.5 11/28/2020     Review of Systems  Constitutional:  Negative for chills and fever.  HENT:  Positive for  postnasal drip and sore throat. Negative for drooling, ear discharge and ear pain.   Eyes:  Positive for redness. Negative for blurred vision, double vision, photophobia, discharge and itching.  Respiratory:  Positive for cough and wheezing. Negative for hemoptysis and shortness of breath.   Cardiovascular:  Negative for chest pain, palpitations and leg swelling.  Gastrointestinal:  Negative for abdominal pain, blood in stool, constipation, diarrhea, heartburn, nausea and vomiting.  Endocrine: Negative for polydipsia.  Genitourinary:  Negative for dysuria, frequency, hematuria and urgency.  Musculoskeletal:  Positive for myalgias. Negative for back pain and neck pain.  Skin:  Negative for rash.  Allergic/Immunologic: Negative for environmental allergies.  Neurological:  Positive for headaches. Negative for dizziness.  Hematological:  Does not bruise/bleed easily.  Psychiatric/Behavioral:  Negative for suicidal ideas. The patient is not nervous/anxious.    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   Hypokalemia 09/06/2017   Fibroids 06/23/2017   Essential hypertension 10/11/2015   Postpartum care following cesarean delivery (1/8) 06/30/2015   Third trimester bleeding 06/29/2015   Anemia affecting pregnancy in first trimester 12/24/2014   Subserous leiomyoma of uterus 12/11/2014   Cervical incompetence with baby delivered 09/16/2014    No Known Allergies  Past Surgical History:  Procedure Laterality Date   ABDOMINAL CERCLAGE  ANKLE SURGERY     CERVICAL CERCLAGE     CESAREAN SECTION     CESAREAN SECTION N/A 06/29/2015   Procedure: CESAREAN SECTION;  Surgeon: Princess Bruins, MD;  Location: [facility];  Service: Obstetrics;  Laterality: N/A;  Most likely surgery after %PM - will be Dr Ronita Hipps instead of Lavoie ATE full meal 1400   FOOT SURGERY     LEEP     ROBOTIC ASSISTED TOTAL HYSTERECTOMY WITH  SALPINGECTOMY Bilateral 06/23/2017   Procedure: ROBOTIC ASSISTED TOTAL HYSTERECTOMY WITH SALPINGECTOMY;  Surgeon: Aloha Gell, MD;  Location: [facility];  Service: Gynecology;  Laterality: Bilateral;   UMBILICAL HERNIA REPAIR N/A 06/23/2017   Procedure: HERNIA REPAIR UMBILICAL ADULT;  Surgeon: Ileana Roup, MD;  Location: [facility];  Service: General;  Laterality: N/A;    Social History   Tobacco Use   Smoking status: Never   Smokeless tobacco: Never  Vaping Use   Vaping Use: Never used  Substance Use Topics   Alcohol use: Yes    Comment: occasional   Drug use: No     Medication list has been reviewed and updated.  Current Meds  Medication Sig   amLODipine (NORVASC) 5 MG tablet Take 1 tablet (5 mg total) by mouth daily.   calcium carbonate (TUMS - DOSED IN MG ELEMENTAL CALCIUM) 500 MG chewable tablet Chew 2 tablets by mouth 2 (two) times daily as needed for indigestion or heartburn.    CINNAMON PO Take 1 each by mouth daily.   losartan (COZAAR) 50 MG tablet Take 1 tablet (50 mg total) by mouth daily.   MAGNESIUM PO Take 1 tablet by mouth daily.   metFORMIN (GLUCOPHAGE-XR) 500 MG 24 hr tablet Take 1 tablet (500 mg total) by mouth daily with breakfast.   metoprolol tartrate (LOPRESSOR) 50 MG tablet Take 1 tablet (50 mg total) by mouth daily.   Potassium Gluconate 550 MG TABS Take 1 tablet by mouth daily. otc   TURMERIC PO Take 1 tablet by mouth daily.   [DISCONTINUED] meloxicam (MOBIC) 7.5 MG tablet TAKE 1 TABLET(7.5 MG) BY MOUTH DAILY    PHQ 2/9 Scores 03/30/2021 11/27/2020 05/27/2015  PHQ - 2 Score 0 1 0  PHQ- 9 Score 0 3 -    GAD 7 : Generalized Anxiety Score 11/27/2020  Nervous, Anxious, on Edge 2  Control/stop worrying 2  Worry too much - different things 1  Trouble relaxing 0  Restless 0  Easily annoyed or irritable 0  Afraid - awful might happen 0  Total GAD 7 Score 5  Anxiety Difficulty Not difficult at all    BP Readings from Last 3 Encounters:  04/27/21  130/80  03/30/21 128/84  11/27/20 128/80    Physical Exam Vitals and nursing note reviewed.  HENT:     Right Ear: Tympanic membrane, ear canal and external ear normal.     Left Ear: Tympanic membrane, ear canal and external ear normal.     Nose: No congestion or rhinorrhea.     Right Sinus: Maxillary sinus tenderness present.     Left Sinus: Maxillary sinus tenderness present.     Mouth/Throat:     Mouth: Mucous membranes are moist.     Pharynx: Oropharynx is clear. No posterior oropharyngeal erythema.  Eyes:     General:        Right eye: No discharge.        Left eye: Discharge present. Pulmonary:     Breath sounds: No wheezing or rhonchi.  Wt Readings from Last 3 Encounters:  04/27/21 242 lb (109.8 kg)  03/30/21 244 lb (110.7 kg)  11/27/20 251 lb (113.9 kg)    BP 130/80   Pulse 68   Ht 5\' 7"  (1.702 m)   Wt 242 lb (109.8 kg)   LMP 05/31/2017 (Exact Date)   BMI 37.90 kg/m   Assessment and Plan:  1. Acute maxillary sinusitis, recurrence not specified New onset.  Persistent.  Stable.  Exam and history is consistent with maxillary sinusitis.  We will treat with Augmentin 875 mg twice a day for 10 days. - amoxicillin-clavulanate (AUGMENTIN) 875-125 MG tablet; Take 1 tablet by mouth 2 (two) times daily.  Dispense: 20 tablet; Refill: 0  2. Acute bacterial conjunctivitis of both eyes New onset.  Persistent.  Primarily of the left eye but on evaluation of both conjunctiva there is erythematous bilateral.  We will treat with TobraDex ophthalmic solution 2 drops in both eyes every 4 hours while awake. - tobramycin-dexamethasone (TOBRADEX) ophthalmic solution; Place 2 drops into both eyes every 4 (four) hours while awake.  Dispense: 5 mL; Refill: 0  3. Acute cough Persistent cough which is affecting sleep and we will treat with Robitussin-AC teaspoon nightly and 8 hours.  Cough. - guaiFENesin-codeine (ROBITUSSIN AC) 100-10 MG/5ML syrup; Take 5 mLs by mouth 3 (three) times  daily as needed for cough.  Dispense: 118 mL; Refill: 0

## 2021-05-11 ENCOUNTER — Ambulatory Visit: Payer: Federal, State, Local not specified - PPO | Admitting: Family Medicine

## 2021-05-22 LAB — HM DIABETES EYE EXAM

## 2021-05-28 DIAGNOSIS — M5416 Radiculopathy, lumbar region: Secondary | ICD-10-CM | POA: Diagnosis not present

## 2021-05-28 DIAGNOSIS — M5432 Sciatica, left side: Secondary | ICD-10-CM | POA: Diagnosis not present

## 2021-05-28 DIAGNOSIS — M9903 Segmental and somatic dysfunction of lumbar region: Secondary | ICD-10-CM | POA: Diagnosis not present

## 2021-05-28 DIAGNOSIS — M9905 Segmental and somatic dysfunction of pelvic region: Secondary | ICD-10-CM | POA: Diagnosis not present

## 2021-06-01 ENCOUNTER — Ambulatory Visit: Payer: Federal, State, Local not specified - PPO | Admitting: Family Medicine

## 2021-06-29 ENCOUNTER — Ambulatory Visit: Payer: Federal, State, Local not specified - PPO | Admitting: Family Medicine

## 2021-07-17 ENCOUNTER — Other Ambulatory Visit: Payer: Self-pay | Admitting: Family Medicine

## 2021-07-17 NOTE — Telephone Encounter (Signed)
Requested Prescriptions  Pending Prescriptions Disp Refills   metFORMIN (GLUCOPHAGE-XR) 500 MG 24 hr tablet [Pharmacy Med Name: METFORMIN ER 500MG 24HR TABS] 90 tablet 0    Sig: TAKE 1 TABLET(500 MG) BY MOUTH DAILY WITH BREAKFAST     Endocrinology:  Diabetes - Biguanides Failed - 07/17/2021  6:47 AM      Failed - HBA1C is between 0 and 7.9 and within 180 days    Hemoglobin A1C  Date Value Ref Range Status  12/17/2019 6.1  Final   Hgb A1c MFr Bld  Date Value Ref Range Status  11/28/2020 6.2 (H) 4.8 - 5.6 % Final    Comment:             Prediabetes: 5.7 - 6.4          Diabetes: >6.4          Glycemic control for adults with diabetes: <7.0          Failed - AA eGFR in normal range and within 360 days    GFR calc Af Amer  Date Value Ref Range Status  06/24/2017 >60 >60 mL/min Final    Comment:    (NOTE) The eGFR has been calculated using the CKD EPI equation. This calculation has not been validated in all clinical situations. eGFR's persistently <60 mL/min signify possible Chronic Kidney Disease.    GFR calc non Af Amer  Date Value Ref Range Status  06/24/2017 >60 >60 mL/min Final   eGFR  Date Value Ref Range Status  11/28/2020 100 >59 mL/min/1.73 Final         Passed - Cr in normal range and within 360 days    Creatinine, Ser  Date Value Ref Range Status  11/28/2020 0.77 0.57 - 1.00 mg/dL Final   Creatinine, Urine  Date Value Ref Range Status  06/29/2015 173.00 mg/dL Final         Passed - Valid encounter within last 6 months    Recent Outpatient Visits          2 months ago Acute maxillary sinusitis, recurrence not specified   [facility] Juline Patch, MD   3 months ago Primary hypertension   [facility] Juline Patch, MD   7 months ago Establishing care with new doctor, encounter for   [place], MD      Future Appointments            In 1 month Juline Patch, MD [facility], [facility]

## 2021-07-20 ENCOUNTER — Other Ambulatory Visit: Payer: Self-pay

## 2021-08-17 ENCOUNTER — Ambulatory Visit: Payer: Federal, State, Local not specified - PPO | Admitting: Family Medicine

## 2021-08-17 ENCOUNTER — Other Ambulatory Visit: Payer: Self-pay

## 2021-08-17 ENCOUNTER — Encounter: Payer: Self-pay | Admitting: Family Medicine

## 2021-08-17 VITALS — BP 130/100 | HR 64 | Ht 67.0 in | Wt 241.0 lb

## 2021-08-17 DIAGNOSIS — E7801 Familial hypercholesterolemia: Secondary | ICD-10-CM | POA: Diagnosis not present

## 2021-08-17 DIAGNOSIS — I10 Essential (primary) hypertension: Secondary | ICD-10-CM

## 2021-08-17 DIAGNOSIS — E876 Hypokalemia: Secondary | ICD-10-CM

## 2021-08-17 DIAGNOSIS — R7303 Prediabetes: Secondary | ICD-10-CM

## 2021-08-17 DIAGNOSIS — Z87898 Personal history of other specified conditions: Secondary | ICD-10-CM

## 2021-08-17 MED ORDER — LOSARTAN POTASSIUM 50 MG PO TABS: 50.0000 mg | ORAL_TABLET | Freq: Every day | ORAL | 1 refills | Status: DC

## 2021-08-17 MED ORDER — METFORMIN HCL ER 500 MG PO TB24: ORAL_TABLET | ORAL | 1 refills | Status: DC

## 2021-08-17 MED ORDER — AMLODIPINE BESYLATE 5 MG PO TABS: 5.0000 mg | ORAL_TABLET | Freq: Every day | ORAL | 1 refills | Status: DC

## 2021-08-17 MED ORDER — METOPROLOL SUCCINATE ER 50 MG PO TB24: 50.0000 mg | ORAL_TABLET | Freq: Every day | ORAL | 1 refills | Status: DC

## 2021-08-17 NOTE — Patient Instructions (Signed)

## 2021-08-17 NOTE — Progress Notes (Signed)
Date:  08/17/2021   Name:  Lorraine Gibson   DOB:  06/25/1980   MRN:  [id number]   Chief Complaint: Hypertension and Diabetes  Hypertension This is a chronic problem. The current episode started more than 1 year ago. The problem has been waxing and waning since onset. The problem is uncontrolled (uncertain/no meds taken today). Pertinent negatives include no blurred vision, chest pain, headaches, neck pain, palpitations or shortness of breath. There are no associated agents to hypertension. Risk factors for coronary artery disease include diabetes mellitus. Past treatments include angiotensin blockers, beta blockers and calcium channel blockers. The current treatment provides moderate improvement. There are no compliance problems.  There is no history of angina, kidney disease, CAD/MI, CVA, heart failure, left ventricular hypertrophy, PVD or retinopathy. There is no history of chronic renal disease, a hypertension causing med or renovascular disease.  Diabetes She presents for her follow-up diabetic visit. Her disease course has been stable. There are no hypoglycemic associated symptoms. Pertinent negatives for hypoglycemia include no dizziness, headaches or nervousness/anxiousness. Pertinent negatives for diabetes include no blurred vision, no chest pain, no foot ulcerations, no polydipsia, no visual change and no weakness. There are no hypoglycemic complications. Symptoms are stable. There are no diabetic complications. Pertinent negatives for diabetic complications include no CVA, PVD or retinopathy. Risk factors for coronary artery disease include hypertension and dyslipidemia (prediabetes).   Lab Results  Component Value Date   NA 142 11/28/2020   K 3.4 (L) 11/28/2020   CO2 25 11/28/2020   GLUCOSE 92 11/28/2020   BUN 11 11/28/2020   CREATININE 0.77 11/28/2020   CALCIUM 9.0 11/28/2020   EGFR 100 11/28/2020   GFRNONAA >60 06/24/2017   Lab Results  Component Value Date   CHOL 174  11/28/2020   HDL 36 (L) 11/28/2020   LDLCALC 113 (H) 11/28/2020   TRIG 137 11/28/2020   Lab Results  Component Value Date   TSH 1.500 11/28/2020   Lab Results  Component Value Date   HGBA1C 6.2 (H) 11/28/2020   Lab Results  Component Value Date   WBC 12.8 (H) 06/24/2017   HGB 10.2 (L) 06/24/2017   HCT 31.5 (L) 06/24/2017   MCV 80.2 06/24/2017   PLT 289 06/24/2017   Lab Results  Component Value Date   ALT 45 (H) 11/28/2020   AST 31 11/28/2020   ALKPHOS 66 11/28/2020   BILITOT 0.5 11/28/2020   No results found for: 25OHVITD2, 25OHVITD3, VD25OH   Review of Systems  Constitutional:  Negative for chills and fever.  HENT:  Negative for drooling, ear discharge, ear pain and sore throat.   Eyes:  Negative for blurred vision.  Respiratory:  Negative for cough, shortness of breath and wheezing.   Cardiovascular:  Negative for chest pain, palpitations and leg swelling.  Gastrointestinal:  Negative for abdominal pain, blood in stool, constipation, diarrhea and nausea.  Endocrine: Negative for polydipsia.  Genitourinary:  Negative for dysuria, frequency, hematuria and urgency.  Musculoskeletal:  Negative for back pain, myalgias and neck pain.  Skin:  Negative for rash.  Allergic/Immunologic: Negative for environmental allergies.  Neurological:  Negative for dizziness, weakness and headaches.  Hematological:  Does not bruise/bleed easily.  Psychiatric/Behavioral:  Negative for suicidal ideas. The patient is not nervous/anxious.    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   Hypokalemia 09/06/2017   Fibroids 06/23/2017   Essential hypertension 10/11/2015  Postpartum care following cesarean delivery (1/8) 06/30/2015   Third trimester bleeding 06/29/2015   Anemia affecting pregnancy in first trimester 12/24/2014   Subserous leiomyoma of uterus 12/11/2014   Cervical incompetence with baby  delivered 09/16/2014    No Known Allergies  Past Surgical History:  Procedure Laterality Date   ABDOMINAL CERCLAGE     ANKLE SURGERY     CERVICAL CERCLAGE     CESAREAN SECTION     CESAREAN SECTION N/A 06/29/2015   Procedure: CESAREAN SECTION;  Surgeon: Princess Bruins, MD;  Location: [facility];  Service: Obstetrics;  Laterality: N/A;  Most likely surgery after %PM - will be Dr Ronita Hipps instead of Lavoie ATE full meal 1400   FOOT SURGERY     LEEP     ROBOTIC ASSISTED TOTAL HYSTERECTOMY WITH SALPINGECTOMY Bilateral 06/23/2017   Procedure: ROBOTIC ASSISTED TOTAL HYSTERECTOMY WITH SALPINGECTOMY;  Surgeon: Aloha Gell, MD;  Location: [facility];  Service: Gynecology;  Laterality: Bilateral;   UMBILICAL HERNIA REPAIR N/A 06/23/2017   Procedure: HERNIA REPAIR UMBILICAL ADULT;  Surgeon: Ileana Roup, MD;  Location: [facility];  Service: General;  Laterality: N/A;    Social History   Tobacco Use   Smoking status: Never   Smokeless tobacco: Never  Vaping Use   Vaping Use: Never used  Substance Use Topics   Alcohol use: Yes    Comment: occasional   Drug use: No     Medication list has been reviewed and updated.  Current Meds  Medication Sig   amLODipine (NORVASC) 5 MG tablet Take 1 tablet (5 mg total) by mouth daily.   calcium carbonate (TUMS - DOSED IN MG ELEMENTAL CALCIUM) 500 MG chewable tablet Chew 2 tablets by mouth 2 (two) times daily as needed for indigestion or heartburn.    CINNAMON PO Take 1 each by mouth daily.   losartan (COZAAR) 50 MG tablet Take 1 tablet (50 mg total) by mouth daily.   MAGNESIUM PO Take 1 tablet by mouth daily.   metFORMIN (GLUCOPHAGE-XR) 500 MG 24 hr tablet TAKE 1 TABLET(500 MG) BY MOUTH DAILY WITH BREAKFAST   metoprolol tartrate (LOPRESSOR) 50 MG tablet Take 1 tablet (50 mg total) by mouth daily. (Patient taking differently: Take 50 mg by mouth daily. Sometimes BID if needed per pt)   Potassium Gluconate 550 MG TABS Take 1 tablet by mouth daily. otc    TURMERIC PO Take 1 tablet by mouth daily.    PHQ 2/9 Scores 08/17/2021 03/30/2021 11/27/2020 05/27/2015  PHQ - 2 Score 0 0 1 0  PHQ- 9 Score 0 0 3 -    GAD 7 : Generalized Anxiety Score 08/17/2021 11/27/2020  Nervous, Anxious, on Edge 0 2  Control/stop worrying 0 2  Worry too much - different things 0 1  Trouble relaxing 0 0  Restless 0 0  Easily annoyed or irritable 0 0  Afraid - awful might happen 0 0  Total GAD 7 Score 0 5  Anxiety Difficulty Not difficult at all Not difficult at all    BP Readings from Last 3 Encounters:  08/17/21 (!) 130/100  04/27/21 130/80  03/30/21 128/84    Physical Exam Vitals and nursing note reviewed.  Constitutional:      Appearance: She is well-developed.  HENT:     Head: Normocephalic.     Right Ear: External ear normal.     Left Ear: External ear normal.  Eyes:     General: Lids are everted, no foreign bodies appreciated. No scleral  icterus.       Left eye: No foreign body or hordeolum.     Conjunctiva/sclera: Conjunctivae normal.     Right eye: Right conjunctiva is not injected.     Left eye: Left conjunctiva is not injected.     Pupils: Pupils are equal, round, and reactive to light.  Neck:     Thyroid: No thyromegaly.     Vascular: No JVD.     Trachea: No tracheal deviation.  Cardiovascular:     Rate and Rhythm: Normal rate and regular rhythm.     Heart sounds: Normal heart sounds. No murmur heard.   No friction rub. No gallop.  Pulmonary:     Effort: Pulmonary effort is normal. No respiratory distress.     Breath sounds: Normal breath sounds. No wheezing or rales.  Abdominal:     General: Bowel sounds are normal.     Palpations: Abdomen is soft. There is no mass.     Tenderness: There is no abdominal tenderness. There is no guarding or rebound.  Musculoskeletal:        General: No tenderness. Normal range of motion.     Cervical back: Normal range of motion and neck supple.  Lymphadenopathy:     Cervical: No cervical  adenopathy.  Skin:    General: Skin is warm.     Findings: No rash.  Neurological:     Mental Status: She is alert and oriented to person, place, and time.     Cranial Nerves: No cranial nerve deficit.     Deep Tendon Reflexes: Reflexes normal.  Psychiatric:        Mood and Affect: Mood is not anxious or depressed.    Wt Readings from Last 3 Encounters:  08/17/21 241 lb (109.3 kg)  04/27/21 242 lb (109.8 kg)  03/30/21 244 lb (110.7 kg)    BP (!) 130/100    Pulse 64    Ht 5' 7"  (1.702 m)    Wt 241 lb (109.3 kg)    LMP 05/31/2017 (Exact Date)    BMI 37.75 kg/m   Assessment and Plan:  1. Primary hypertension Chronic.  Uncontrolled.  Stable.  Patient did not take her medication today...  Not surprising that blood pressure is elevated to 130/100.  We will resume losartan 50 mg and amlodipine 5 mg once a day.  Patient is either not remembering the second dosing of metoprolol or is not taking it to see if she really needs it depending on how she feels and I told her that blood pressure does not come to be able to always indicate that she has an elevated reading.  Therefore we will change to a metoprolol 50 mg XL so that she only needs to take it once a day.  Patient will return in 4 months for blood pressure recheck.  In the meantime we will check renal function panel to evaluate electrolytes and GFR. - Renal Function Panel - losartan (COZAAR) 50 MG tablet; Take 1 tablet (50 mg total) by mouth daily.  Dispense: 90 tablet; Refill: 1 - amLODipine (NORVASC) 5 MG tablet; Take 1 tablet (5 mg total) by mouth daily.  Dispense: 90 tablet; Refill: 1 - metoprolol succinate (TOPROL-XL) 50 MG 24 hr tablet; Take 1 tablet (50 mg total) by mouth daily. Take with or immediately following a meal.  Dispense: 90 tablet; Refill: 1  2. Prediabetes Chronic.  Controlled.  Stable.  However we have had to go from 750 XR to 500 XR.  We will check her A1c today to see if this is sufficient patient does understand that  we may need to go back up to the previous dosing which did cause some mild diarrhea she is currently able to tolerate the 500 XR without side effects. - HgB A1c - Renal Function Panel - metFORMIN (GLUCOPHAGE-XR) 500 MG 24 hr tablet; TAKE 1 TABLET(500 MG) BY MOUTH DAILY WITH BREAKFAST  Dispense: 90 tablet; Refill: 1  3. Hypokalemia Chronic.  Controlled.  Stable.  Patient has history of decreased hypokalemia and we will check renal function panel to assess this at this time. - Renal Function Panel  4. Familial hypercholesterolemia Chronic.  Controlled.  Stable.  It was noted that patient has decreased HDL and increased LDL but ratio is just out of range.  Given this fact we will repeat a lipid panel and see where her ratio is at this time and we have discussed the likelihood that patient may need to be on a low-cholesterol diet or may be statin agent given her history of prediabetes and that this has gone into her diabetic range. - Lipid Panel With LDL/HDL Ratio - Microalbumin, urine  5. History of palpitations Reviewed with patient that at some past dates she has seen cardiology for palpitations and she was told "that her potassium was" therefore we are checking her potassium but the other possibility is that she was put on metoprolol for breakthrough SVT or sinus tach.  We will resume metoprolol 50 mg but in an XL form rather than her take in the 1 dosing and at times forgetting or skipping the second dose. - metoprolol succinate (TOPROL-XL) 50 MG 24 hr tablet; Take 1 tablet (50 mg total) by mouth daily. Take with or immediately following a meal.  Dispense: 90 tablet; Refill: 1

## 2021-08-18 LAB — LIPID PANEL WITH LDL/HDL RATIO
Cholesterol, Total: 168 mg/dL (ref 100–199)
HDL: 41 mg/dL (ref >39)
LDL Chol Calc (NIH): 106 mg/dL — ABNORMAL HIGH (ref 0–99)
LDL/HDL Ratio: 2.6 ratio (ref 0.0–3.2)
Triglycerides: 115 mg/dL (ref 0–149)
VLDL Cholesterol Cal: 21 mg/dL (ref 5–40)

## 2021-08-18 LAB — RENAL FUNCTION PANEL
Albumin: 4.5 g/dL (ref 3.8–4.8)
BUN/Creatinine Ratio: 17 (ref 9–23)
BUN: 11 mg/dL (ref 6–24)
CO2: 25 mmol/L (ref 20–29)
Calcium: 8.9 mg/dL (ref 8.7–10.2)
Chloride: 100 mmol/L (ref 96–106)
Creatinine, Ser: 0.63 mg/dL (ref 0.57–1.00)
Glucose: 89 mg/dL (ref 70–99)
Phosphorus: 3.9 mg/dL (ref 3.0–4.3)
Potassium: 3.4 mmol/L — ABNORMAL LOW (ref 3.5–5.2)
Sodium: 142 mmol/L (ref 134–144)
eGFR: 114 mL/min/{1.73_m2} (ref >59)

## 2021-08-18 LAB — MICROALBUMIN, URINE: Microalbumin, Urine: 98 ug/mL

## 2021-08-18 LAB — HEMOGLOBIN A1C
Est. average glucose Bld gHb Est-mCnc: 128 mg/dL
Hgb A1c MFr Bld: 6.1 % — ABNORMAL HIGH (ref 4.8–5.6)

## 2021-09-07 DIAGNOSIS — M9905 Segmental and somatic dysfunction of pelvic region: Secondary | ICD-10-CM | POA: Diagnosis not present

## 2021-09-07 DIAGNOSIS — M9903 Segmental and somatic dysfunction of lumbar region: Secondary | ICD-10-CM | POA: Diagnosis not present

## 2021-09-07 DIAGNOSIS — M5432 Sciatica, left side: Secondary | ICD-10-CM | POA: Diagnosis not present

## 2021-09-07 DIAGNOSIS — M5416 Radiculopathy, lumbar region: Secondary | ICD-10-CM | POA: Diagnosis not present

## 2021-10-15 ENCOUNTER — Other Ambulatory Visit: Payer: Self-pay | Admitting: Family Medicine

## 2021-12-16 DIAGNOSIS — M9903 Segmental and somatic dysfunction of lumbar region: Secondary | ICD-10-CM | POA: Diagnosis not present

## 2021-12-16 DIAGNOSIS — M5416 Radiculopathy, lumbar region: Secondary | ICD-10-CM | POA: Diagnosis not present

## 2021-12-16 DIAGNOSIS — M9905 Segmental and somatic dysfunction of pelvic region: Secondary | ICD-10-CM | POA: Diagnosis not present

## 2021-12-16 DIAGNOSIS — M5432 Sciatica, left side: Secondary | ICD-10-CM | POA: Diagnosis not present

## 2022-01-05 ENCOUNTER — Ambulatory Visit: Payer: Federal, State, Local not specified - PPO | Admitting: Family Medicine

## 2022-01-08 ENCOUNTER — Ambulatory Visit: Payer: Federal, State, Local not specified - PPO | Admitting: Family Medicine

## 2022-01-08 ENCOUNTER — Encounter: Payer: Self-pay | Admitting: Family Medicine

## 2022-01-08 VITALS — BP 144/94 | HR 76 | Ht 67.0 in | Wt 249.0 lb

## 2022-01-08 DIAGNOSIS — B958 Unspecified staphylococcus as the cause of diseases classified elsewhere: Secondary | ICD-10-CM | POA: Diagnosis not present

## 2022-01-08 DIAGNOSIS — L089 Local infection of the skin and subcutaneous tissue, unspecified: Secondary | ICD-10-CM

## 2022-01-08 MED ORDER — AMOXICILLIN-POT CLAVULANATE 875-125 MG PO TABS: 1.0000 | ORAL_TABLET | Freq: Two times a day (BID) | ORAL | 0 refills | Status: DC

## 2022-01-08 MED ORDER — MUPIROCIN 2 % EX OINT: 1.0000 | TOPICAL_OINTMENT | Freq: Two times a day (BID) | CUTANEOUS | 0 refills | Status: AC

## 2022-01-08 NOTE — Progress Notes (Signed)
Date:  01/08/2022   Name:  Lorraine Gibson   DOB:  07-Apr-1981   MRN:  [id number]   Chief Complaint: Infected Belly Button  (X2 week, brown fluid, smells bad,itching, has keloids in the area, has had a hernia surgery in the past, )  Rash Chronicity: wound/umbillicus. The problem has been gradually worsening since onset. Location: umbillical/keloids. Pertinent negatives include no fever. Treatments tried: peroxide. The treatment provided no relief.    Lab Results  Component Value Date   NA 142 08/17/2021   K 3.4 (L) 08/17/2021   CO2 25 08/17/2021   GLUCOSE 89 08/17/2021   BUN 11 08/17/2021   CREATININE 0.63 08/17/2021   CALCIUM 8.9 08/17/2021   EGFR 114 08/17/2021   GFRNONAA >60 06/24/2017   Lab Results  Component Value Date   CHOL 168 08/17/2021   HDL 41 08/17/2021   LDLCALC 106 (H) 08/17/2021   TRIG 115 08/17/2021   Lab Results  Component Value Date   TSH 1.500 11/28/2020   Lab Results  Component Value Date   HGBA1C 6.1 (H) 08/17/2021   Lab Results  Component Value Date   WBC 12.8 (H) 06/24/2017   HGB 10.2 (L) 06/24/2017   HCT 31.5 (L) 06/24/2017   MCV 80.2 06/24/2017   PLT 289 06/24/2017   Lab Results  Component Value Date   ALT 45 (H) 11/28/2020   AST 31 11/28/2020   ALKPHOS 66 11/28/2020   BILITOT 0.5 11/28/2020   No results found for: "25OHVITD2", "25OHVITD3", "VD25OH"   Review of Systems  Constitutional:  Negative for fever.  Skin:  Positive for wound.       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   Hypokalemia 09/06/2017   Fibroids 06/23/2017   Essential hypertension 10/11/2015   Postpartum care following cesarean delivery (1/8) 06/30/2015   Third trimester bleeding 06/29/2015   Anemia affecting pregnancy in first trimester 12/24/2014   Subserous leiomyoma of uterus 12/11/2014   Cervical incompetence  with baby delivered 09/16/2014    No Known Allergies  Past Surgical History:  Procedure Laterality Date   ABDOMINAL CERCLAGE     ANKLE SURGERY     CERVICAL CERCLAGE     CESAREAN SECTION     CESAREAN SECTION N/A 06/29/2015   Procedure: CESAREAN SECTION;  Surgeon: Princess Bruins, MD;  Location: [facility];  Service: Obstetrics;  Laterality: N/A;  Most likely surgery after %PM - will be Dr Ronita Hipps instead of Lavoie ATE full meal 1400   FOOT SURGERY     LEEP     ROBOTIC ASSISTED TOTAL HYSTERECTOMY WITH SALPINGECTOMY Bilateral 06/23/2017   Procedure: ROBOTIC ASSISTED TOTAL HYSTERECTOMY WITH SALPINGECTOMY;  Surgeon: Aloha Gell, MD;  Location: [facility];  Service: Gynecology;  Laterality: Bilateral;   UMBILICAL HERNIA REPAIR N/A 06/23/2017   Procedure: HERNIA REPAIR UMBILICAL ADULT;  Surgeon: Ileana Roup, MD;  Location: [facility];  Service: General;  Laterality: N/A;    Social History   Tobacco Use   Smoking status: Never   Smokeless tobacco: Never  Vaping Use   Vaping Use: Never used  Substance Use Topics   Alcohol use: Yes    Comment: occasional   Drug use: No     Medication list has been reviewed and updated.  Current Meds  Medication Sig   amLODipine (NORVASC) 5 MG tablet Take 1 tablet (5 mg  total) by mouth daily.   calcium carbonate (TUMS - DOSED IN MG ELEMENTAL CALCIUM) 500 MG chewable tablet Chew 2 tablets by mouth 2 (two) times daily as needed for indigestion or heartburn.    CINNAMON PO Take 1 each by mouth daily.   losartan (COZAAR) 50 MG tablet Take 1 tablet (50 mg total) by mouth daily.   MAGNESIUM PO Take 1 tablet by mouth daily.   meloxicam (MOBIC) 7.5 MG tablet Take 7.5 mg by mouth daily.   metFORMIN (GLUCOPHAGE-XR) 500 MG 24 hr tablet TAKE 1 TABLET(500 MG) BY MOUTH DAILY WITH BREAKFAST   metoprolol succinate (TOPROL-XL) 50 MG 24 hr tablet Take 1 tablet (50 mg total) by mouth daily. Take with or immediately following a meal.   metoprolol tartrate (LOPRESSOR) 50  MG tablet TAKE 1 TABLET(50 MG) BY MOUTH DAILY   Potassium Gluconate 550 MG TABS Take 1 tablet by mouth daily. otc   [DISCONTINUED] TURMERIC PO Take 1 tablet by mouth daily.       01/08/2022    3:13 PM 08/17/2021   11:15 AM 11/27/2020    2:59 PM  GAD 7 : Generalized Anxiety Score  Nervous, Anxious, on Edge 1 0 2  Control/stop worrying 1 0 2  Worry too much - different things 1 0 1  Trouble relaxing 1 0 0  Restless 0 0 0  Easily annoyed or irritable 0 0 0  Afraid - awful might happen 2 0 0  Total GAD 7 Score 6 0 5  Anxiety Difficulty Somewhat difficult Not difficult at all Not difficult at all       01/08/2022    3:13 PM 08/17/2021   11:15 AM 03/30/2021    3:11 PM  Depression screen PHQ 2/9  Decreased Interest 0 0 0  Down, Depressed, Hopeless 0 0 0  PHQ - 2 Score 0 0 0  Altered sleeping 0 0 0  Tired, decreased energy 1 0 0  Change in appetite 0 0 0  Feeling bad or failure about yourself  0 0 0  Trouble concentrating 1 0 0  Moving slowly or fidgety/restless 0 0 0  Suicidal thoughts 0 0 0  PHQ-9 Score 2 0 0  Difficult doing work/chores Somewhat difficult Not difficult at all Not difficult at all    BP Readings from Last 3 Encounters:  01/08/22 (!) 144/94  08/17/21 (!) 130/100  04/27/21 130/80    Physical Exam Vitals and nursing note reviewed.  Cardiovascular:     Pulses: Normal pulses.     Heart sounds: No murmur heard.    No friction rub. No gallop.  Pulmonary:     Breath sounds: No wheezing, rhonchi or rales.  Chest:     Chest wall: No tenderness.  Skin:    Findings: Erythema present.     Comments: Serous purulence     Wt Readings from Last 3 Encounters:  01/08/22 249 lb (112.9 kg)  08/17/21 241 lb (109.3 kg)  04/27/21 242 lb (109.8 kg)    BP (!) 144/94   Pulse 76   Ht _0  (1.702 m)   Wt 249 lb (112.9 kg)   LMP 05/31/2017 (Exact Date)   SpO2 96%   BMI 39.00 kg/m   Assessment and Plan:  1. Staphylococcal infection of skin New onset.  Stable.   Area has erythema with a serous yellow fluid presumably of a purulent nature.  Most likely secondary infection due to retained fluid because of surrounding keloids to the umbilicus.  Patient's been instructed that she needs to clean the area with an antibacterial soap such as Hibiclens on a regular basis and dry it out with Q-tips afterwards.  Patient has been given Bactroban to use twice a day for about 5 to 7 days and Augmentin 875 mg twice a day for 7 days.  Patient is to return if unresolved and if it does recur next time.  Perhaps involve dermatology. - amoxicillin-clavulanate (AUGMENTIN) 875-125 MG tablet; Take 1 tablet by mouth 2 (two) times daily.  Dispense: 20 tablet; Refill: 0 - mupirocin ointment (BACTROBAN) 2 %; Apply 1 Application topically 2 (two) times daily.  Dispense: 22 g; Refill: 0

## 2022-01-13 ENCOUNTER — Other Ambulatory Visit: Payer: Self-pay | Admitting: Family Medicine

## 2022-02-05 DIAGNOSIS — M5432 Sciatica, left side: Secondary | ICD-10-CM | POA: Diagnosis not present

## 2022-02-05 DIAGNOSIS — M5416 Radiculopathy, lumbar region: Secondary | ICD-10-CM | POA: Diagnosis not present

## 2022-02-05 DIAGNOSIS — M9903 Segmental and somatic dysfunction of lumbar region: Secondary | ICD-10-CM | POA: Diagnosis not present

## 2022-02-05 DIAGNOSIS — M9905 Segmental and somatic dysfunction of pelvic region: Secondary | ICD-10-CM | POA: Diagnosis not present

## 2022-02-15 DIAGNOSIS — Z1231 Encounter for screening mammogram for malignant neoplasm of breast: Secondary | ICD-10-CM | POA: Diagnosis not present

## 2022-02-15 DIAGNOSIS — Z01419 Encounter for gynecological examination (general) (routine) without abnormal findings: Secondary | ICD-10-CM | POA: Diagnosis not present

## 2022-02-15 DIAGNOSIS — Z118 Encounter for screening for other infectious and parasitic diseases: Secondary | ICD-10-CM | POA: Diagnosis not present

## 2022-02-15 DIAGNOSIS — Z113 Encounter for screening for infections with a predominantly sexual mode of transmission: Secondary | ICD-10-CM | POA: Diagnosis not present

## 2022-02-15 DIAGNOSIS — Z1159 Encounter for screening for other viral diseases: Secondary | ICD-10-CM | POA: Diagnosis not present

## 2022-02-15 DIAGNOSIS — Z6838 Body mass index (BMI) 38.0-38.9, adult: Secondary | ICD-10-CM | POA: Diagnosis not present

## 2022-02-15 DIAGNOSIS — Z114 Encounter for screening for human immunodeficiency virus [HIV]: Secondary | ICD-10-CM | POA: Diagnosis not present

## 2022-02-17 DIAGNOSIS — N6001 Solitary cyst of right breast: Secondary | ICD-10-CM | POA: Diagnosis not present

## 2022-02-17 DIAGNOSIS — R928 Other abnormal and inconclusive findings on diagnostic imaging of breast: Secondary | ICD-10-CM | POA: Diagnosis not present

## 2022-02-19 ENCOUNTER — Ambulatory Visit: Payer: Federal, State, Local not specified - PPO | Admitting: Family Medicine

## 2022-02-19 ENCOUNTER — Encounter: Payer: Self-pay | Admitting: Family Medicine

## 2022-02-19 VITALS — BP 138/80 | HR 64 | Ht 67.0 in | Wt 246.0 lb

## 2022-02-19 DIAGNOSIS — Z87898 Personal history of other specified conditions: Secondary | ICD-10-CM

## 2022-02-19 DIAGNOSIS — I10 Essential (primary) hypertension: Secondary | ICD-10-CM | POA: Diagnosis not present

## 2022-02-19 DIAGNOSIS — R7303 Prediabetes: Secondary | ICD-10-CM

## 2022-02-19 DIAGNOSIS — Z23 Encounter for immunization: Secondary | ICD-10-CM

## 2022-02-19 MED ORDER — LOSARTAN POTASSIUM 50 MG PO TABS: 50.0000 mg | ORAL_TABLET | Freq: Every day | ORAL | 1 refills | Status: DC

## 2022-02-19 MED ORDER — METOPROLOL SUCCINATE ER 50 MG PO TB24: 50.0000 mg | ORAL_TABLET | Freq: Every day | ORAL | 1 refills | Status: AC

## 2022-02-19 MED ORDER — METFORMIN HCL ER 500 MG PO TB24: ORAL_TABLET | ORAL | 1 refills | Status: AC

## 2022-02-19 MED ORDER — AMLODIPINE BESYLATE 5 MG PO TABS
5.0000 mg | ORAL_TABLET | Freq: Every day | ORAL | 1 refills | Status: AC
Start: 2022-02-19 — End: ?

## 2022-02-19 NOTE — Progress Notes (Signed)
Date:  02/19/2022   Name:  Lorraine Gibson   DOB:  09/09/1980   MRN:  [id number]   Chief Complaint: Prediabetes and Hypertension  Hypertension This is a chronic problem. The current episode started more than 1 year ago. The problem has been gradually improving since onset. The problem is controlled. Pertinent negatives include no blurred vision, chest pain, headaches, neck pain, orthopnea, palpitations, PND or shortness of breath. Past treatments include angiotensin blockers, calcium channel blockers and beta blockers. The current treatment provides moderate improvement. There are no compliance problems.  There is no history of angina, kidney disease, CAD/MI, CVA, heart failure, left ventricular hypertrophy, PVD or retinopathy. There is no history of chronic renal disease, a hypertension causing med or renovascular disease.  Diabetes She presents for her follow-up diabetic visit. She has type 2 diabetes mellitus. Pertinent negatives for hypoglycemia include no dizziness, headaches or nervousness/anxiousness. Pertinent negatives for diabetes include no blurred vision, no chest pain, no fatigue, no foot paresthesias, no foot ulcerations, no polydipsia, no polyphagia, no polyuria, no visual change, no weakness and no weight loss. Symptoms are stable. Pertinent negatives for diabetic complications include no CVA, PVD or retinopathy. Current diabetic treatment includes oral agent (monotherapy).    Lab Results  Component Value Date   NA 142 08/17/2021   K 3.4 (L) 08/17/2021   CO2 25 08/17/2021   GLUCOSE 89 08/17/2021   BUN 11 08/17/2021   CREATININE 0.63 08/17/2021   CALCIUM 8.9 08/17/2021   EGFR 114 08/17/2021   GFRNONAA >60 06/24/2017   Lab Results  Component Value Date   CHOL 168 08/17/2021   HDL 41 08/17/2021   LDLCALC 106 (H) 08/17/2021   TRIG 115 08/17/2021   Lab Results  Component Value Date   TSH 1.500 11/28/2020   Lab Results  Component Value Date   HGBA1C 6.1 (H)  08/17/2021   Lab Results  Component Value Date   WBC 12.8 (H) 06/24/2017   HGB 10.2 (L) 06/24/2017   HCT 31.5 (L) 06/24/2017   MCV 80.2 06/24/2017   PLT 289 06/24/2017   Lab Results  Component Value Date   ALT 45 (H) 11/28/2020   AST 31 11/28/2020   ALKPHOS 66 11/28/2020   BILITOT 0.5 11/28/2020   No results found for: "25OHVITD2", "25OHVITD3", "VD25OH"   Review of Systems  Constitutional:  Negative for chills, fatigue, fever and weight loss.  HENT:  Negative for drooling, ear discharge, ear pain and sore throat.   Eyes:  Negative for blurred vision.  Respiratory:  Negative for cough, shortness of breath and wheezing.   Cardiovascular:  Negative for chest pain, palpitations, orthopnea, leg swelling and PND.  Gastrointestinal:  Negative for abdominal pain, blood in stool, constipation, diarrhea and nausea.  Endocrine: Negative for polydipsia, polyphagia and polyuria.  Genitourinary:  Negative for dysuria, frequency, hematuria and urgency.  Musculoskeletal:  Negative for back pain, myalgias and neck pain.  Skin:  Negative for rash.  Allergic/Immunologic: Negative for environmental allergies.  Neurological:  Negative for dizziness, weakness and headaches.  Hematological:  Does not bruise/bleed easily.  Psychiatric/Behavioral:  Negative for suicidal ideas. The patient is not nervous/anxious.     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   Hypokalemia 09/06/2017   Fibroids 06/23/2017   Essential hypertension 10/11/2015   Postpartum care following cesarean delivery (1/8) 06/30/2015   Third trimester bleeding 06/29/2015   Anemia affecting pregnancy  in first trimester 12/24/2014   Subserous leiomyoma of uterus 12/11/2014   Cervical incompetence with baby delivered 09/16/2014    No Known Allergies  Past Surgical History:  Procedure Laterality Date   ABDOMINAL CERCLAGE     ANKLE SURGERY      CERVICAL CERCLAGE     CESAREAN SECTION     CESAREAN SECTION N/A 06/29/2015   Procedure: CESAREAN SECTION;  Surgeon: Princess Bruins, MD;  Location: [facility];  Service: Obstetrics;  Laterality: N/A;  Most likely surgery after %PM - will be Dr Ronita Hipps instead of Lavoie ATE full meal 1400   FOOT SURGERY     LEEP     ROBOTIC ASSISTED TOTAL HYSTERECTOMY WITH SALPINGECTOMY Bilateral 06/23/2017   Procedure: ROBOTIC ASSISTED TOTAL HYSTERECTOMY WITH SALPINGECTOMY;  Surgeon: Aloha Gell, MD;  Location: [facility];  Service: Gynecology;  Laterality: Bilateral;   UMBILICAL HERNIA REPAIR N/A 06/23/2017   Procedure: HERNIA REPAIR UMBILICAL ADULT;  Surgeon: Ileana Roup, MD;  Location: [facility];  Service: General;  Laterality: N/A;    Social History   Tobacco Use   Smoking status: Never   Smokeless tobacco: Never  Vaping Use   Vaping Use: Never used  Substance Use Topics   Alcohol use: Yes    Comment: occasional   Drug use: No     Medication list has been reviewed and updated.  Current Meds  Medication Sig   amLODipine (NORVASC) 5 MG tablet Take 1 tablet (5 mg total) by mouth daily.   calcium carbonate (TUMS - DOSED IN MG ELEMENTAL CALCIUM) 500 MG chewable tablet Chew 2 tablets by mouth 2 (two) times daily as needed for indigestion or heartburn.    CINNAMON PO Take 1 each by mouth daily.   losartan (COZAAR) 50 MG tablet Take 1 tablet (50 mg total) by mouth daily.   MAGNESIUM PO Take 1 tablet by mouth daily.   meloxicam (MOBIC) 7.5 MG tablet Take 7.5 mg by mouth daily.   metFORMIN (GLUCOPHAGE-XR) 500 MG 24 hr tablet TAKE 1 TABLET(500 MG) BY MOUTH DAILY WITH BREAKFAST   metoprolol tartrate (LOPRESSOR) 50 MG tablet TAKE 1 TABLET(50 MG) BY MOUTH DAILY   mupirocin ointment (BACTROBAN) 2 % Apply 1 Application topically 2 (two) times daily.   Potassium Gluconate 550 MG TABS Take 1 tablet by mouth daily. otc   [DISCONTINUED] metoprolol succinate (TOPROL-XL) 50 MG 24 hr tablet Take 1 tablet (50 mg  total) by mouth daily. Take with or immediately following a meal.       02/19/2022   11:00 AM 01/08/2022    3:13 PM 08/17/2021   11:15 AM 11/27/2020    2:59 PM  GAD 7 : Generalized Anxiety Score  Nervous, Anxious, on Edge 0 1 0 2  Control/stop worrying 0 1 0 2  Worry too much - different things 1 1 0 1  Trouble relaxing 1 1 0 0  Restless 0 0 0 0  Easily annoyed or irritable 0 0 0 0  Afraid - awful might happen 1 2 0 0  Total GAD 7 Score 3 6 0 5  Anxiety Difficulty Not difficult at all Somewhat difficult Not difficult at all Not difficult at all       02/19/2022   11:00 AM 01/08/2022    3:13 PM 08/17/2021   11:15 AM  Depression screen PHQ 2/9  Decreased Interest 0 0 0  Down, Depressed, Hopeless 0 0 0  PHQ - 2 Score 0 0 0  Altered sleeping 0 0  0  Tired, decreased energy 1 1 0  Change in appetite 0 0 0  Feeling bad or failure about yourself  0 0 0  Trouble concentrating 1 1 0  Moving slowly or fidgety/restless 0 0 0  Suicidal thoughts 0 0 0  PHQ-9 Score 2 2 0  Difficult doing work/chores Not difficult at all Somewhat difficult Not difficult at all    BP Readings from Last 3 Encounters:  02/19/22 138/80  01/08/22 (!) 144/94  08/17/21 (!) 130/100    Physical Exam Vitals and nursing note reviewed. Exam conducted with a chaperone present.  Constitutional:      General: She is not in acute distress.    Appearance: She is not diaphoretic.  HENT:     Head: Normocephalic and atraumatic.     Right Ear: External ear normal.     Left Ear: External ear normal.     Nose: Nose normal.  Eyes:     General:        Right eye: No discharge.        Left eye: No discharge.     Conjunctiva/sclera: Conjunctivae normal.     Pupils: Pupils are equal, round, and reactive to light.  Neck:     Thyroid: No thyromegaly.     Vascular: No JVD.  Cardiovascular:     Rate and Rhythm: Normal rate and regular rhythm.     Heart sounds: Normal heart sounds. No murmur heard.    No friction rub. No  gallop.  Pulmonary:     Effort: Pulmonary effort is normal.     Breath sounds: Normal breath sounds.  Abdominal:     General: Bowel sounds are normal.     Palpations: Abdomen is soft. There is no mass.     Tenderness: There is no abdominal tenderness. There is no guarding.  Musculoskeletal:        General: Normal range of motion.     Cervical back: Normal range of motion and neck supple.  Lymphadenopathy:     Cervical: No cervical adenopathy.  Skin:    General: Skin is warm and dry.  Neurological:     Mental Status: She is alert.     Deep Tendon Reflexes: Reflexes are normal and symmetric.     Wt Readings from Last 3 Encounters:  02/19/22 246 lb (111.6 kg)  01/08/22 249 lb (112.9 kg)  08/17/21 241 lb (109.3 kg)    BP 138/80   Pulse 64   Ht _0  (1.702 m)   Wt 246 lb (111.6 kg)   LMP 05/31/2017 (Exact Date)   BMI 38.53 kg/m   Assessment and Plan:  1. Primary hypertension Chronic.  Controlled.  Stable.  Blood pressure today 138/80.  Continue amlodipine 5 mg, Sartin 50 mg, and metoprolol XL 50 mg we will check renal function panel for electrolytes and GFR. - amLODipine (NORVASC) 5 MG tablet; Take 1 tablet (5 mg total) by mouth daily.  Dispense: 90 tablet; Refill: 1 - losartan (COZAAR) 50 MG tablet; Take 1 tablet (50 mg total) by mouth daily.  Dispense: 90 tablet; Refill: 1 - metoprolol succinate (TOPROL-XL) 50 MG 24 hr tablet; Take 1 tablet (50 mg total) by mouth daily. Take with or immediately following a meal.  Dispense: 90 tablet; Refill: 1 - Renal Function Panel  2. Prediabetes Chronic.  Controlled. 500 wellness renal function panel for left lower - metFORMIN (GLUCOPHAGE-XR) 500 MG 24 hr tablet; TAKE 1 TABLET(500 MG) BY MOUTH DAILY WITH BREAKFAST  Dispense: 90 tablet; Refill: 1 - HgB A1c - Renal Function Panel  3. History of palpitations History of palpitations controlled with metoprolol XL 50 mg - metoprolol succinate (TOPROL-XL) 50 MG 24 hr tablet; Take 1  tablet (50 mg total) by mouth daily. Take with or immediately following a meal.  Dispense: 90 tablet; Refill: 1  4. Need for immunization against influenza Discussed and administered. - Flu Vaccine QUAD 50moIM (Fluarix, Fluzone & Alfiuria Quad PF)    DOtilio Miu MD

## 2022-02-20 LAB — RENAL FUNCTION PANEL
Albumin: 4.6 g/dL (ref 3.9–4.9)
BUN/Creatinine Ratio: 17 (ref 9–23)
BUN: 12 mg/dL (ref 6–24)
CO2: 25 mmol/L (ref 20–29)
Calcium: 9.1 mg/dL (ref 8.7–10.2)
Chloride: 98 mmol/L (ref 96–106)
Creatinine, Ser: 0.71 mg/dL (ref 0.57–1.00)
Glucose: 96 mg/dL (ref 70–99)
Phosphorus: 3.4 mg/dL (ref 3.0–4.3)
Potassium: 3.4 mmol/L — ABNORMAL LOW (ref 3.5–5.2)
Sodium: 141 mmol/L (ref 134–144)
eGFR: 109 mL/min/{1.73_m2} (ref >59)

## 2022-02-20 LAB — HEMOGLOBIN A1C
Est. average glucose Bld gHb Est-mCnc: 134 mg/dL
Hgb A1c MFr Bld: 6.3 % — ABNORMAL HIGH (ref 4.8–5.6)

## 2022-02-23 ENCOUNTER — Telehealth: Payer: Self-pay

## 2022-02-23 NOTE — Telephone Encounter (Signed)
Pt returned our call. Shared provider's note. Pt is upset with A1C. Pt would like help with reducing her A1C. Pt is taking her Metformin daily.   Pt would like to see someone to help her with this.  Please advise.  Juline Patch, MD  02/22/2022  9:46 AM EDT     A1c still in prediabetic range acceptable

## 2022-03-23 ENCOUNTER — Other Ambulatory Visit: Payer: Self-pay | Admitting: Family Medicine

## 2022-03-23 MED ORDER — MELOXICAM 7.5 MG PO TABS: 7.5000 mg | ORAL_TABLET | Freq: Every day | ORAL | 0 refills | Status: AC

## 2022-03-23 NOTE — Telephone Encounter (Signed)
Medication Refill - Medication: meloxicam (MOBIC) 7.5 MG tablet   Has the patient contacted their pharmacy? yes (Agent: If no, request that the patient contact the pharmacy for the refill. If patient does not wish to contact the pharmacy document the reason why and proceed with request.) (Agent: If yes, when and what did the pharmacy advise?)contact pcp  Preferred Pharmacy (with phone number or street name):  [id number] - [place], [address] AT [place] Phone:  [phone]  Fax:  [phone]     Has the patient been seen for an appointment in the last year OR does the patient have an upcoming appointment? yes  Agent: Please be advised that RX refills may take up to 3 business days. We ask that you follow-up with your pharmacy.

## 2022-03-23 NOTE — Telephone Encounter (Signed)
Requested medication (s) are due for refill today: yes  Requested medication (s) are on the active medication list: yes  Last refill: 01/08/22  Future visit scheduled: yes  Notes to clinic:  Unable to refill per protocol, last refill by another provider.  Historical provider, routing for review     Requested Prescriptions  Pending Prescriptions Disp Refills   meloxicam (MOBIC) 7.5 MG tablet      Sig: Take 1 tablet (7.5 mg total) by mouth daily.     Analgesics:  COX2 Inhibitors Failed - 03/23/2022  9:07 AM      Failed - Manual Review: Labs are only required if the patient has taken medication for more than 8 weeks.      Failed - HGB in normal range and within 360 days    Hemoglobin  Date Value Ref Range Status  06/24/2017 10.2 (L) 12.0 - 15.0 g/dL Final         Failed - HCT in normal range and within 360 days    HCT  Date Value Ref Range Status  06/24/2017 31.5 (L) 36.0 - 46.0 % Final         Failed - AST in normal range and within 360 days    AST  Date Value Ref Range Status  11/28/2020 31 0 - 40 IU/L Final         Failed - ALT in normal range and within 360 days    ALT  Date Value Ref Range Status  11/28/2020 45 (H) 0 - 32 IU/L Final         Passed - Cr in normal range and within 360 days    Creatinine, Ser  Date Value Ref Range Status  02/19/2022 0.71 0.57 - 1.00 mg/dL Final   Creatinine, Urine  Date Value Ref Range Status  06/29/2015 173.00 mg/dL Final         Passed - eGFR is 30 or above and within 360 days    GFR calc Af Amer  Date Value Ref Range Status  06/24/2017 >60 >60 mL/min Final    Comment:    (NOTE) The eGFR has been calculated using the CKD EPI equation. This calculation has not been validated in all clinical situations. eGFR's persistently <60 mL/min signify possible Chronic Kidney Disease.    GFR calc non Af Amer  Date Value Ref Range Status  06/24/2017 >60 >60 mL/min Final   eGFR  Date Value Ref Range Status  02/19/2022 109 >59  mL/min/1.73 Final         Passed - Patient is not pregnant      Passed - Valid encounter within last 12 months    Recent Outpatient Visits           1 month ago Primary hypertension   [facility] [place], Deanna C, MD   2 months ago Staphylococcal infection of skin   [facility] [place], Deanna C, MD   7 months ago Primary hypertension   [facility] [place], Deanna C, MD   11 months ago Acute maxillary sinusitis, recurrence not specified   [place] [place], Deanna C, MD   11 months ago Primary hypertension   [place] [place], Deanna C, MD       Future Appointments  In 3 months Juline Patch, MD [facility] at [facility], [facility]

## 2022-03-29 DIAGNOSIS — M955 Acquired deformity of pelvis: Secondary | ICD-10-CM | POA: Diagnosis not present

## 2022-03-29 DIAGNOSIS — M9905 Segmental and somatic dysfunction of pelvic region: Secondary | ICD-10-CM | POA: Diagnosis not present

## 2022-03-29 DIAGNOSIS — M9903 Segmental and somatic dysfunction of lumbar region: Secondary | ICD-10-CM | POA: Diagnosis not present

## 2022-03-29 DIAGNOSIS — M5416 Radiculopathy, lumbar region: Secondary | ICD-10-CM | POA: Diagnosis not present

## 2022-05-03 DIAGNOSIS — M5416 Radiculopathy, lumbar region: Secondary | ICD-10-CM | POA: Diagnosis not present

## 2022-05-03 DIAGNOSIS — M9903 Segmental and somatic dysfunction of lumbar region: Secondary | ICD-10-CM | POA: Diagnosis not present

## 2022-05-03 DIAGNOSIS — M9905 Segmental and somatic dysfunction of pelvic region: Secondary | ICD-10-CM | POA: Diagnosis not present

## 2022-05-03 DIAGNOSIS — M955 Acquired deformity of pelvis: Secondary | ICD-10-CM | POA: Diagnosis not present

## 2022-05-31 DIAGNOSIS — Z1330 Encounter for screening examination for mental health and behavioral disorders, unspecified: Secondary | ICD-10-CM | POA: Diagnosis not present

## 2022-05-31 DIAGNOSIS — E6609 Other obesity due to excess calories: Secondary | ICD-10-CM | POA: Diagnosis not present

## 2022-05-31 DIAGNOSIS — Z1331 Encounter for screening for depression: Secondary | ICD-10-CM | POA: Diagnosis not present

## 2022-05-31 DIAGNOSIS — Z0000 Encounter for general adult medical examination without abnormal findings: Secondary | ICD-10-CM | POA: Diagnosis not present

## 2022-05-31 DIAGNOSIS — I10 Essential (primary) hypertension: Secondary | ICD-10-CM | POA: Diagnosis not present

## 2022-05-31 DIAGNOSIS — R7303 Prediabetes: Secondary | ICD-10-CM | POA: Diagnosis not present

## 2022-05-31 DIAGNOSIS — Z6838 Body mass index (BMI) 38.0-38.9, adult: Secondary | ICD-10-CM | POA: Diagnosis not present

## 2022-06-01 DIAGNOSIS — E876 Hypokalemia: Secondary | ICD-10-CM | POA: Diagnosis not present

## 2022-06-08 DIAGNOSIS — E876 Hypokalemia: Secondary | ICD-10-CM | POA: Diagnosis not present

## 2022-06-15 DIAGNOSIS — R350 Frequency of micturition: Secondary | ICD-10-CM | POA: Diagnosis not present

## 2022-06-15 DIAGNOSIS — I10 Essential (primary) hypertension: Secondary | ICD-10-CM | POA: Diagnosis not present

## 2022-06-24 ENCOUNTER — Ambulatory Visit: Payer: Federal, State, Local not specified - PPO | Admitting: Family Medicine

## 2022-06-25 ENCOUNTER — Ambulatory Visit: Payer: Federal, State, Local not specified - PPO | Admitting: Family Medicine

## 2022-07-28 DIAGNOSIS — M5416 Radiculopathy, lumbar region: Secondary | ICD-10-CM | POA: Diagnosis not present

## 2022-07-28 DIAGNOSIS — M9905 Segmental and somatic dysfunction of pelvic region: Secondary | ICD-10-CM | POA: Diagnosis not present

## 2022-07-28 DIAGNOSIS — M9903 Segmental and somatic dysfunction of lumbar region: Secondary | ICD-10-CM | POA: Diagnosis not present

## 2022-07-28 DIAGNOSIS — M955 Acquired deformity of pelvis: Secondary | ICD-10-CM | POA: Diagnosis not present

## 2022-08-25 DIAGNOSIS — M9903 Segmental and somatic dysfunction of lumbar region: Secondary | ICD-10-CM | POA: Diagnosis not present

## 2022-08-25 DIAGNOSIS — M5416 Radiculopathy, lumbar region: Secondary | ICD-10-CM | POA: Diagnosis not present

## 2022-08-25 DIAGNOSIS — M9905 Segmental and somatic dysfunction of pelvic region: Secondary | ICD-10-CM | POA: Diagnosis not present

## 2022-08-25 DIAGNOSIS — M955 Acquired deformity of pelvis: Secondary | ICD-10-CM | POA: Diagnosis not present

## 2022-08-30 DIAGNOSIS — R7303 Prediabetes: Secondary | ICD-10-CM | POA: Diagnosis not present

## 2022-08-30 DIAGNOSIS — F439 Reaction to severe stress, unspecified: Secondary | ICD-10-CM | POA: Diagnosis not present

## 2022-08-30 DIAGNOSIS — F419 Anxiety disorder, unspecified: Secondary | ICD-10-CM | POA: Diagnosis not present

## 2022-09-24 ENCOUNTER — Other Ambulatory Visit: Payer: Self-pay | Admitting: Family Medicine

## 2022-09-24 DIAGNOSIS — I10 Essential (primary) hypertension: Secondary | ICD-10-CM

## 2022-09-27 NOTE — Telephone Encounter (Signed)
Requested Prescriptions  Pending Prescriptions Disp Refills   losartan (COZAAR) 50 MG tablet [Pharmacy Med Name: LOSARTAN 50MG  TABLETS] 90 tablet 0    Sig: TAKE 1 TABLET(50 MG) BY MOUTH DAILY     Cardiovascular:  Angiotensin Receptor Blockers Failed - 09/24/2022  7:37 PM      Failed - Cr in normal range and within 180 days    Creatinine, Ser  Date Value Ref Range Status  02/19/2022 0.71 0.57 - 1.00 mg/dL Final   Creatinine, Urine  Date Value Ref Range Status  06/29/2015 173.00 mg/dL Final         Failed - K in normal range and within 180 days    Potassium  Date Value Ref Range Status  02/19/2022 3.4 (L) 3.5 - 5.2 mmol/L Final         Failed - Valid encounter within last 6 months    Recent Outpatient Visits           7 months ago Primary hypertension   [facility], [facility]   8 months ago Staphylococcal infection of skin   [place] [facility], MD   1 year ago Primary hypertension   [place] [facility], MD   1 year ago Acute maxillary sinusitis, recurrence not specified   [place], MD   1 year ago Primary hypertension    [facility], MD              Passed - Patient is not pregnant      Passed - Last BP in normal range    BP Readings from Last 1 Encounters:  02/19/22 138/80

## 2022-09-29 DIAGNOSIS — M9905 Segmental and somatic dysfunction of pelvic region: Secondary | ICD-10-CM | POA: Diagnosis not present

## 2022-09-29 DIAGNOSIS — M5416 Radiculopathy, lumbar region: Secondary | ICD-10-CM | POA: Diagnosis not present

## 2022-09-29 DIAGNOSIS — M9903 Segmental and somatic dysfunction of lumbar region: Secondary | ICD-10-CM | POA: Diagnosis not present

## 2022-09-29 DIAGNOSIS — M955 Acquired deformity of pelvis: Secondary | ICD-10-CM | POA: Diagnosis not present

## 2022-10-14 ENCOUNTER — Other Ambulatory Visit: Payer: Self-pay | Admitting: Family Medicine

## 2022-10-14 DIAGNOSIS — I10 Essential (primary) hypertension: Secondary | ICD-10-CM

## 2022-10-14 DIAGNOSIS — Z87898 Personal history of other specified conditions: Secondary | ICD-10-CM

## 2022-11-02 DIAGNOSIS — R635 Abnormal weight gain: Secondary | ICD-10-CM | POA: Diagnosis not present

## 2022-11-02 DIAGNOSIS — I10 Essential (primary) hypertension: Secondary | ICD-10-CM | POA: Diagnosis not present

## 2022-11-10 DIAGNOSIS — M955 Acquired deformity of pelvis: Secondary | ICD-10-CM | POA: Diagnosis not present

## 2022-11-10 DIAGNOSIS — M5416 Radiculopathy, lumbar region: Secondary | ICD-10-CM | POA: Diagnosis not present

## 2022-11-10 DIAGNOSIS — M9905 Segmental and somatic dysfunction of pelvic region: Secondary | ICD-10-CM | POA: Diagnosis not present

## 2022-11-10 DIAGNOSIS — M9903 Segmental and somatic dysfunction of lumbar region: Secondary | ICD-10-CM | POA: Diagnosis not present

## 2022-12-22 DIAGNOSIS — M955 Acquired deformity of pelvis: Secondary | ICD-10-CM | POA: Diagnosis not present

## 2022-12-22 DIAGNOSIS — M9903 Segmental and somatic dysfunction of lumbar region: Secondary | ICD-10-CM | POA: Diagnosis not present

## 2022-12-22 DIAGNOSIS — M5416 Radiculopathy, lumbar region: Secondary | ICD-10-CM | POA: Diagnosis not present

## 2022-12-22 DIAGNOSIS — M9905 Segmental and somatic dysfunction of pelvic region: Secondary | ICD-10-CM | POA: Diagnosis not present

## 2023-01-04 ENCOUNTER — Other Ambulatory Visit: Payer: Self-pay | Admitting: Family Medicine

## 2023-01-04 DIAGNOSIS — I10 Essential (primary) hypertension: Secondary | ICD-10-CM

## 2023-01-05 NOTE — Telephone Encounter (Signed)
Unable to refill per protocol,another provider listed as PCP. Patient needs OV with Dr. Yetta Barre for refills.  Requested Prescriptions  Pending Prescriptions Disp Refills   losartan (COZAAR) 50 MG tablet [Pharmacy Med Name: LOSARTAN 50MG  TABLETS] 90 tablet 0    Sig: TAKE 1 TABLET(50 MG) BY MOUTH DAILY     Cardiovascular:  Angiotensin Receptor Blockers Failed - 01/04/2023  1:33 PM      Failed - Cr in normal range and within 180 days    Creatinine, Ser  Date Value Ref Range Status  02/19/2022 0.71 0.57 - 1.00 mg/dL Final   Creatinine, Urine  Date Value Ref Range Status  06/29/2015 173.00 mg/dL Final         Failed - K in normal range and within 180 days    Potassium  Date Value Ref Range Status  02/19/2022 3.4 (L) 3.5 - 5.2 mmol/L Final         Failed - Valid encounter within last 6 months    Recent Outpatient Visits           10 months ago Primary hypertension   [place] [facility], MD   12 months ago Staphylococcal infection of skin   [place], MD   1 year ago Primary hypertension   [place] [facility], MD   1 year ago Acute maxillary sinusitis, recurrence not specified   [place] [facility], MD   1 year ago Primary hypertension   [place] [facility], MD              Passed - Patient is not pregnant      Passed - Last BP in normal range    BP Readings from Last 1 Encounters:  02/19/22 138/80

## 2023-01-26 DIAGNOSIS — M9903 Segmental and somatic dysfunction of lumbar region: Secondary | ICD-10-CM | POA: Diagnosis not present

## 2023-01-26 DIAGNOSIS — M955 Acquired deformity of pelvis: Secondary | ICD-10-CM | POA: Diagnosis not present

## 2023-01-26 DIAGNOSIS — M9905 Segmental and somatic dysfunction of pelvic region: Secondary | ICD-10-CM | POA: Diagnosis not present

## 2023-01-26 DIAGNOSIS — M5416 Radiculopathy, lumbar region: Secondary | ICD-10-CM | POA: Diagnosis not present

## 2023-02-18 DIAGNOSIS — Z1231 Encounter for screening mammogram for malignant neoplasm of breast: Secondary | ICD-10-CM | POA: Diagnosis not present

## 2023-02-23 DIAGNOSIS — M9903 Segmental and somatic dysfunction of lumbar region: Secondary | ICD-10-CM | POA: Diagnosis not present

## 2023-02-23 DIAGNOSIS — M9905 Segmental and somatic dysfunction of pelvic region: Secondary | ICD-10-CM | POA: Diagnosis not present

## 2023-02-23 DIAGNOSIS — M955 Acquired deformity of pelvis: Secondary | ICD-10-CM | POA: Diagnosis not present

## 2023-02-23 DIAGNOSIS — M5416 Radiculopathy, lumbar region: Secondary | ICD-10-CM | POA: Diagnosis not present

## 2023-03-16 DIAGNOSIS — M5416 Radiculopathy, lumbar region: Secondary | ICD-10-CM | POA: Diagnosis not present

## 2023-03-16 DIAGNOSIS — M9903 Segmental and somatic dysfunction of lumbar region: Secondary | ICD-10-CM | POA: Diagnosis not present

## 2023-03-16 DIAGNOSIS — M9905 Segmental and somatic dysfunction of pelvic region: Secondary | ICD-10-CM | POA: Diagnosis not present

## 2023-03-16 DIAGNOSIS — M955 Acquired deformity of pelvis: Secondary | ICD-10-CM | POA: Diagnosis not present

## 2023-03-25 DIAGNOSIS — M9903 Segmental and somatic dysfunction of lumbar region: Secondary | ICD-10-CM | POA: Diagnosis not present

## 2023-03-25 DIAGNOSIS — M9905 Segmental and somatic dysfunction of pelvic region: Secondary | ICD-10-CM | POA: Diagnosis not present

## 2023-03-25 DIAGNOSIS — M955 Acquired deformity of pelvis: Secondary | ICD-10-CM | POA: Diagnosis not present

## 2023-03-25 DIAGNOSIS — M5416 Radiculopathy, lumbar region: Secondary | ICD-10-CM | POA: Diagnosis not present

## 2023-03-31 DIAGNOSIS — M9905 Segmental and somatic dysfunction of pelvic region: Secondary | ICD-10-CM | POA: Diagnosis not present

## 2023-03-31 DIAGNOSIS — M9903 Segmental and somatic dysfunction of lumbar region: Secondary | ICD-10-CM | POA: Diagnosis not present

## 2023-03-31 DIAGNOSIS — M5416 Radiculopathy, lumbar region: Secondary | ICD-10-CM | POA: Diagnosis not present

## 2023-03-31 DIAGNOSIS — M955 Acquired deformity of pelvis: Secondary | ICD-10-CM | POA: Diagnosis not present

## 2023-04-07 DIAGNOSIS — M9903 Segmental and somatic dysfunction of lumbar region: Secondary | ICD-10-CM | POA: Diagnosis not present

## 2023-04-07 DIAGNOSIS — M5416 Radiculopathy, lumbar region: Secondary | ICD-10-CM | POA: Diagnosis not present

## 2023-04-07 DIAGNOSIS — M9905 Segmental and somatic dysfunction of pelvic region: Secondary | ICD-10-CM | POA: Diagnosis not present

## 2023-04-07 DIAGNOSIS — M955 Acquired deformity of pelvis: Secondary | ICD-10-CM | POA: Diagnosis not present

## 2023-04-15 DIAGNOSIS — M9905 Segmental and somatic dysfunction of pelvic region: Secondary | ICD-10-CM | POA: Diagnosis not present

## 2023-04-15 DIAGNOSIS — M955 Acquired deformity of pelvis: Secondary | ICD-10-CM | POA: Diagnosis not present

## 2023-04-15 DIAGNOSIS — M9903 Segmental and somatic dysfunction of lumbar region: Secondary | ICD-10-CM | POA: Diagnosis not present

## 2023-04-15 DIAGNOSIS — M5416 Radiculopathy, lumbar region: Secondary | ICD-10-CM | POA: Diagnosis not present

## 2023-04-22 DIAGNOSIS — M545 Low back pain, unspecified: Secondary | ICD-10-CM | POA: Diagnosis not present

## 2023-04-22 DIAGNOSIS — M47816 Spondylosis without myelopathy or radiculopathy, lumbar region: Secondary | ICD-10-CM | POA: Diagnosis not present

## 2023-04-22 DIAGNOSIS — R7303 Prediabetes: Secondary | ICD-10-CM | POA: Diagnosis not present

## 2023-04-22 DIAGNOSIS — G8929 Other chronic pain: Secondary | ICD-10-CM | POA: Diagnosis not present

## 2023-04-22 DIAGNOSIS — Z23 Encounter for immunization: Secondary | ICD-10-CM | POA: Diagnosis not present

## 2023-04-29 DIAGNOSIS — M955 Acquired deformity of pelvis: Secondary | ICD-10-CM | POA: Diagnosis not present

## 2023-04-29 DIAGNOSIS — M9903 Segmental and somatic dysfunction of lumbar region: Secondary | ICD-10-CM | POA: Diagnosis not present

## 2023-04-29 DIAGNOSIS — M9905 Segmental and somatic dysfunction of pelvic region: Secondary | ICD-10-CM | POA: Diagnosis not present

## 2023-04-29 DIAGNOSIS — M5416 Radiculopathy, lumbar region: Secondary | ICD-10-CM | POA: Diagnosis not present

## 2023-05-13 DIAGNOSIS — M9905 Segmental and somatic dysfunction of pelvic region: Secondary | ICD-10-CM | POA: Diagnosis not present

## 2023-05-13 DIAGNOSIS — M5416 Radiculopathy, lumbar region: Secondary | ICD-10-CM | POA: Diagnosis not present

## 2023-05-13 DIAGNOSIS — M9903 Segmental and somatic dysfunction of lumbar region: Secondary | ICD-10-CM | POA: Diagnosis not present

## 2023-05-13 DIAGNOSIS — M955 Acquired deformity of pelvis: Secondary | ICD-10-CM | POA: Diagnosis not present

## 2023-06-03 DIAGNOSIS — M9905 Segmental and somatic dysfunction of pelvic region: Secondary | ICD-10-CM | POA: Diagnosis not present

## 2023-06-03 DIAGNOSIS — Z1331 Encounter for screening for depression: Secondary | ICD-10-CM | POA: Diagnosis not present

## 2023-06-03 DIAGNOSIS — Z01419 Encounter for gynecological examination (general) (routine) without abnormal findings: Secondary | ICD-10-CM | POA: Diagnosis not present

## 2023-06-03 DIAGNOSIS — M5416 Radiculopathy, lumbar region: Secondary | ICD-10-CM | POA: Diagnosis not present

## 2023-06-03 DIAGNOSIS — Z113 Encounter for screening for infections with a predominantly sexual mode of transmission: Secondary | ICD-10-CM | POA: Diagnosis not present

## 2023-06-03 DIAGNOSIS — Z1159 Encounter for screening for other viral diseases: Secondary | ICD-10-CM | POA: Diagnosis not present

## 2023-06-03 DIAGNOSIS — M955 Acquired deformity of pelvis: Secondary | ICD-10-CM | POA: Diagnosis not present

## 2023-06-03 DIAGNOSIS — Z118 Encounter for screening for other infectious and parasitic diseases: Secondary | ICD-10-CM | POA: Diagnosis not present

## 2023-06-03 DIAGNOSIS — M9903 Segmental and somatic dysfunction of lumbar region: Secondary | ICD-10-CM | POA: Diagnosis not present

## 2023-06-03 DIAGNOSIS — Z114 Encounter for screening for human immunodeficiency virus [HIV]: Secondary | ICD-10-CM | POA: Diagnosis not present

## 2023-06-09 DIAGNOSIS — Z0000 Encounter for general adult medical examination without abnormal findings: Secondary | ICD-10-CM | POA: Diagnosis not present

## 2023-06-09 DIAGNOSIS — Z6838 Body mass index (BMI) 38.0-38.9, adult: Secondary | ICD-10-CM | POA: Diagnosis not present

## 2023-06-09 DIAGNOSIS — R7303 Prediabetes: Secondary | ICD-10-CM | POA: Diagnosis not present

## 2023-06-09 DIAGNOSIS — Z1331 Encounter for screening for depression: Secondary | ICD-10-CM | POA: Diagnosis not present

## 2023-06-17 DIAGNOSIS — E119 Type 2 diabetes mellitus without complications: Secondary | ICD-10-CM | POA: Diagnosis not present

## 2023-06-17 DIAGNOSIS — E876 Hypokalemia: Secondary | ICD-10-CM | POA: Diagnosis not present
